# Patient Record
Sex: Male | Born: 1940 | Race: White | Hispanic: No | Marital: Married | Smoking: Former smoker
Health system: Southern US, Community
[De-identification: ages and names within clinical notes are randomized; demographics above are authoritative.]

## PROBLEM LIST (undated history)

## (undated) DIAGNOSIS — I714 Abdominal aortic aneurysm, without rupture, unspecified: Secondary | ICD-10-CM

## (undated) DIAGNOSIS — C50919 Malignant neoplasm of unspecified site of unspecified female breast: Secondary | ICD-10-CM

## (undated) DIAGNOSIS — E785 Hyperlipidemia, unspecified: Secondary | ICD-10-CM

## (undated) DIAGNOSIS — F0390 Unspecified dementia without behavioral disturbance: Secondary | ICD-10-CM

## (undated) DIAGNOSIS — I10 Essential (primary) hypertension: Secondary | ICD-10-CM

## (undated) DIAGNOSIS — I447 Left bundle-branch block, unspecified: Secondary | ICD-10-CM

## (undated) DIAGNOSIS — I509 Heart failure, unspecified: Secondary | ICD-10-CM

## (undated) HISTORY — DX: Abdominal aortic aneurysm, without rupture, unspecified: I71.40

## (undated) HISTORY — DX: Abdominal aortic aneurysm, without rupture: I71.4

## (undated) HISTORY — DX: Malignant neoplasm of unspecified site of unspecified female breast: C50.919

## (undated) HISTORY — DX: Hyperlipidemia, unspecified: E78.5

## (undated) HISTORY — DX: Unspecified dementia, unspecified severity, without behavioral disturbance, psychotic disturbance, mood disturbance, and anxiety: F03.90

## (undated) HISTORY — DX: Heart failure, unspecified: I50.9

## (undated) HISTORY — PX: MASTECTOMY: SHX3

## (undated) HISTORY — DX: Left bundle-branch block, unspecified: I44.7

## (undated) HISTORY — DX: Essential (primary) hypertension: I10

---

## 2000-04-20 ENCOUNTER — Encounter: Payer: Self-pay | Admitting: Family Medicine

## 2000-04-20 ENCOUNTER — Encounter: Admission: RE | Admit: 2000-04-20 | Discharge: 2000-04-20 | Payer: Self-pay | Admitting: Family Medicine

## 2004-07-04 ENCOUNTER — Encounter: Admission: RE | Admit: 2004-07-04 | Discharge: 2004-07-04 | Payer: Self-pay | Admitting: Orthopedic Surgery

## 2006-05-23 IMAGING — NM NM BONE 3 PHASE
10 series · 10 of 10 positions shown · non-contrast
Comparison: none

CLINICAL DATA: Left second toe pain for eight months.
 NUCLEAR MEDICINE THREE PHASE BONE SCAN ? 07/04/04

[st statics, dual detec · 2.36mm/px · 1 of 1 slices shown (1 of 5)]
[im 1/1]
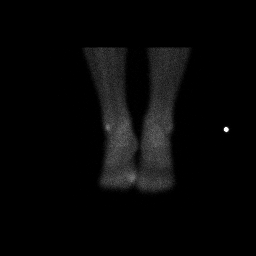

[st statics, dual detec · 2.33mm/px · 1 of 1 slices shown (2 of 5)]
[im 1/1]
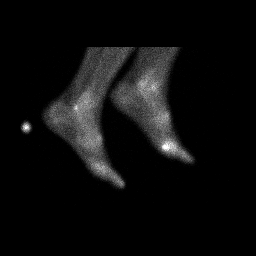

[bf bone flow · 2.33mm/px · 1 of 1 slices shown (1 of 5)]
[im 1/1]
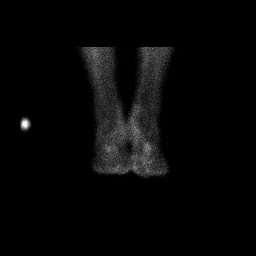

[st statics, dual detec · 2.36mm/px · 1 of 1 slices shown (3 of 5)]
[im 1/1]
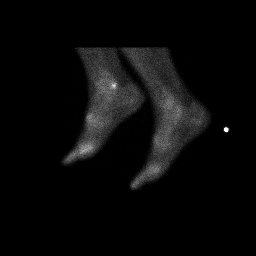

[st statics, dual detec · 2.36mm/px · 1 of 1 slices shown (4 of 5)]
[im 1/1]
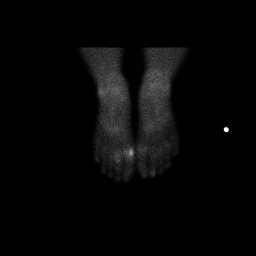

[bf bone flow · 2.33mm/px · 1 of 1 slices shown (2 of 5)]
[im 1/1]
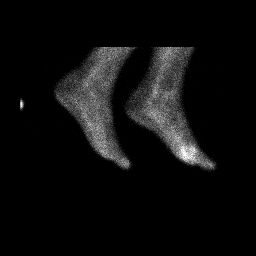

[st statics, dual detec · 2.33mm/px · 1 of 1 slices shown (5 of 5)]
[im 1/1]
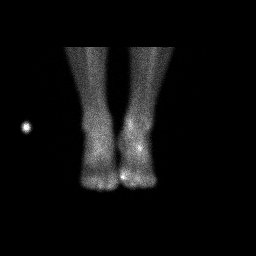

[bf bone flow · 2.36mm/px · 1 of 1 slices shown (3 of 5)]
[im 1/1]
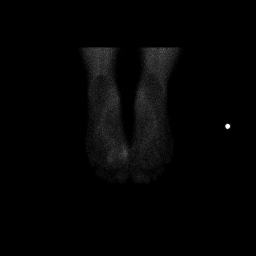

[bf bone flow · 2.36mm/px · 1 of 1 slices shown (4 of 5)]
[im 1/1]
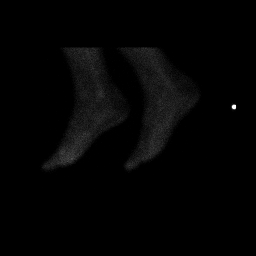

[bf bone flow · 2.36mm/px · 1 of 1 slices shown (5 of 5)]
[im 1/1]
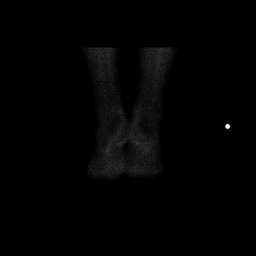

[10 of 10 positions shown; findings below may reference images not displayed]

FINDINGS: With IV injection of 26.1 mCi of Kechnetium-YYm MDP, triple phase bone scan of the bilateral ankles and feet is correlated with previous left forefoot radiographs of [DATE] phase shows symmetrical normal activity.  Progressive on second and third phase is increased uptake primarily at the medial aspect of the left first metatarsal phalangeal joint with lesser uptake at the base of the left second toe.  Progressive increased uptake is also seen at the left greater than right anterior metatarsal tarsal and lateral malleolar region of the left ankle.  No other area of increased or decreased tracer is seen.
 IMPRESSION
 1.  Nonspecific increased uptake, most marked at the medial aspect of the left first metatarsal phalangeal joint with lesser uptake at the base of the left second toe, bilateral anterior tarsal metatarsal articulation, and left lateral malleolus.  These findings may represent chronic stress or early degenerative change.
 2.  Otherwise no significant abnormality.

## 2006-05-26 ENCOUNTER — Inpatient Hospital Stay (HOSPITAL_BASED_OUTPATIENT_CLINIC_OR_DEPARTMENT_OTHER): Admission: RE | Admit: 2006-05-26 | Discharge: 2006-05-26 | Payer: Self-pay | Admitting: Cardiovascular Disease

## 2006-05-26 HISTORY — PX: CARDIAC CATHETERIZATION: SHX172

## 2010-09-30 ENCOUNTER — Ambulatory Visit: Payer: Self-pay | Admitting: Cardiovascular Disease

## 2010-11-16 ENCOUNTER — Encounter: Payer: Self-pay | Admitting: Orthopedic Surgery

## 2011-03-10 ENCOUNTER — Telehealth: Payer: Self-pay | Admitting: Cardiovascular Disease

## 2011-03-10 NOTE — Telephone Encounter (Signed)
Called and spoke w/ mr Roh...HOH and asked if i would cll back tomorrow and speak with his wife.Alfonso Ramus RN

## 2011-03-10 NOTE — Telephone Encounter (Signed)
Please call back when the fax has been made. [phone]. I have pulled the chart.

## 2011-03-10 NOTE — Telephone Encounter (Signed)
Wanted Dr. Elease Hashimoto to fax over what specific blood work test he wants done to [facility] in [place] [phone] ATTN: Harrold Donath (PA) in relation to the next scheduled OV for her husband.

## 2011-03-11 NOTE — Telephone Encounter (Signed)
Wife given update, lab order was faxed.

## 2011-03-13 NOTE — H&P (Signed)
NAMEBUFORD, BREMER            ACCOUNT NO.:  [id number]   MEDICAL RECORD NO.:  [id number]           PATIENT TYPE:   LOCATION:                                 FACILITY:   PHYSICIAN:  Vesta Mixer, M.D. DATE OF BIRTH:  [birth year]   DATE OF ADMISSION:  DATE OF DISCHARGE:                                HISTORY & PHYSICAL   Adam Martin is a 70 year old gentleman who was recently found to have  an abnormal EKG.  He was also found to have hypertension.  We saw him  recently for a stress Cardiolite study, which was abnormal.  He was found to  have reduced left ventricular systolic function with an ejection fraction of  46%.  An echocardiogram also found a reduced ejection fraction, but it  appeared to be more like 25-30% EF.  He was also found to have mild mitral  regurgitation, mild LAE.  He also had some empiric relaxation.  He is now  scheduled for a heart catheterization for further evaluation.   He has had some profound weakness and some shortness of breath.  He has  never had any angina, per say.  He is really not a complainer.  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  He is not been slowed down too much.  He has  eaten lots of salt in the past but states that he has been very careful with  his salt over the past two weeks.   CURRENT MEDICATIONS:  1.  Cozaar 100 mg a day.  2.  Toprol XL 50 mg p.o. b.i.d.  3.  Aspirin 325 mg daily.  4.  Folic acid 800 mg a day.  5.  Fish oil capsules 1200 mg a day.  6.  Vitamin C 500 mg a day.  7.  Tylenol as needed.  8.  Lexapro 5 mg a day.  9.  Lipitor 80 mg a day.  10. Tiazac 360 mg a day.  11. Colchicine 0.6 mg a day.  12. Garlic 2000 mg a day.   ALLERGIES:  None.   PAST MEDICAL HISTORY:  1.  Hypertension.  2.  Hyperlipidemia.   SOCIAL HISTORY:  The patient used to smoke but quit 28 years ago.  He does  not drink alcohol.   FAMILY HISTORY:  His father died at age 48 due to a myocardial infarction.  His mother died at age  82 due to cancer.   REVIEW OF SYSTEMS:  Reviewed and is essentially negative, except as noted in  the HPI.   PHYSICAL EXAMINATION:  VITAL SIGNS:  His weight is 186, which is down 8  pounds from  the last visit.  Blood pressure is 140/88 with a heart rate of  52.  GENERAL:  He is a middle-aged gentleman in no acute distress.  He is alert  and oriented x3 and his mood and affect are normal.  NECK:  Carotids 2+.  He has no bruits.  No JVD.  No thyromegaly.  LUNGS:  Clear to auscultation.  HEART:  Regular rate.  S1 and S2 with no murmurs.  ABDOMEN:  Good bowel sounds and is nontender.  EXTREMITIES:  He has no clubbing, cyanosis or edema.  NEUROLOGIC:  Nonfocal.   Because of [facility]'s moderate-to-severe left ventricular dysfunction by echo,  I would like to go ahead and proceed with a heart catheterization.  There is  a good chance that this is due to a hypertensive cardiomyopathy, but we  certainly need to rule out coronary artery disease.  We have discussed the  risks, benefits and options of heart catheterization.  He understands and  agrees to proceed.           ______________________________  Vesta Mixer, M.D.     PJN/MEDQ  D:  05/24/2006  T:  05/24/2006  Job:  [id number]   cc:   Dr. Pierce Crane

## 2011-03-13 NOTE — Cardiovascular Report (Signed)
NAMEDEKKER, VERGA             ACCOUNT NO.:  [id number]   MEDICAL RECORD NO.:  [id number]          PATIENT TYPE:  OIB   LOCATION:  1966                         FACILITY:  [facility]   PHYSICIAN:  Vesta Mixer, M.D. DATE OF BIRTH:  [birth year]   DATE OF PROCEDURE:  05/26/2006  DATE OF DISCHARGE:                              CARDIAC CATHETERIZATION   Adam Martin is a 70 year old gentleman with a new onset of congestive  heart failure.  He recently found to have abnormal left ventricular systolic  function by Cardiolite scanning.  He is referred for heart catheterization  for further evaluation.   PROCEDURE:  Left heart catheterization.  We performed coronary angiography.  We were not able to cross into the left ventricle due to lack of support  from his tortuous iliac arteries.   We also performed a distal aortogram.   ANGIOGRAPHY:  Left main:  The left main is essentially normal.   The left anterior descending artery is fairly tortuous.  There is mild-to-  moderate disease in the proximal segment ranging between 10-30%.  There are  no critical stenoses.  The first diagonal artery is a small-to-moderate-  sized vessel.  It is approximately 2.0-2.5 mm in size.  There is a proximal  80% stenosis right at the bifurcation.   The second diagonal artery has minor luminal irregularities.   The left circumflex artery is a fairly large vessel.  It has minor luminal  irregularities.  There is a very large first obtuse marginal artery which is  normal.  The second obtuse marginal artery is also normal.   The right coronary artery is a moderate-sized vessel and is dominant.  There  is a 40% proximal stenosis.  This stenosis is somewhat eccentric.  The  remainder of the right coronary artery has mild irregularities.  It gives  off a small-to-moderate-sized posterior descending artery which is  essentially normal.   No left ventriculogram was performed because of lack of support and  tortuous  iliac arteries.  We tried a long exchange wire, but still we were not able  to safely get up to cross the left ventricle.  We were able to cross with a  guidewire.  I did want to a risk a perforation of his iliac arteries.   We did perform an angiogram of his iliac system.  It revealed very tortuous,  but relatively no significant atherosclerosis of the iliac arteries.  If we  need to do a heart catheterization in the future, we will consider using a  long sheath if we are able to get it up to the central aorta.   COMPLICATIONS:  None.   CONCLUSION:  1.  Minor coronary artery irregularities.  2.  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  We will continue with aggressive medical therapy.  We      know that his ejection fraction is around 45% by echocardiogram and by      Cardiolite scanning.  In fact, the echocardiogram suggests that his left      ventricular systolic function is  between      25-30%.  We have already started him on beta blockers and ACE inhalers.      He does not have significant coronary artery disease to explain this      moderate to severe left ventricular dysfunction.  It most likely is due      to his longstanding hypertension.  We will continue with aggressive      medical therapy.           ______________________________  Vesta Mixer, M.D.     PJN/MEDQ  D:  05/26/2006  T:  05/26/2006  Job:  [id number]   cc:   Burnell Blanks, MD

## 2011-03-17 ENCOUNTER — Other Ambulatory Visit: Payer: Self-pay | Admitting: Cardiovascular Disease

## 2011-03-17 ENCOUNTER — Encounter: Payer: Self-pay | Admitting: Physician Assistant

## 2011-03-17 DIAGNOSIS — I10 Essential (primary) hypertension: Secondary | ICD-10-CM

## 2011-04-09 ENCOUNTER — Encounter: Payer: Self-pay | Admitting: Cardiovascular Disease

## 2011-04-14 ENCOUNTER — Ambulatory Visit: Payer: Self-pay | Admitting: Cardiovascular Disease

## 2011-04-15 ENCOUNTER — Telehealth: Payer: Self-pay | Admitting: Cardiovascular Disease

## 2011-04-15 NOTE — Telephone Encounter (Signed)
Called in wanting to know if her husbands labs had been faxed in from his general practitioner [facility]). Please call back.

## 2011-04-15 NOTE — Telephone Encounter (Signed)
Labs were scanned in/are available, ot informed

## 2011-05-01 ENCOUNTER — Ambulatory Visit (INDEPENDENT_AMBULATORY_CARE_PROVIDER_SITE_OTHER): Payer: Medicare Other | Admitting: Cardiovascular Disease

## 2011-05-01 ENCOUNTER — Encounter: Payer: Self-pay | Admitting: Cardiovascular Disease

## 2011-05-01 VITALS — BP 145/80 | HR 60 | Ht 70.0 in | Wt 165.4 lb

## 2011-05-01 DIAGNOSIS — E785 Hyperlipidemia, unspecified: Secondary | ICD-10-CM | POA: Insufficient documentation

## 2011-05-01 DIAGNOSIS — I509 Heart failure, unspecified: Secondary | ICD-10-CM

## 2011-05-01 DIAGNOSIS — I10 Essential (primary) hypertension: Secondary | ICD-10-CM | POA: Insufficient documentation

## 2011-05-01 DIAGNOSIS — F0390 Unspecified dementia without behavioral disturbance: Secondary | ICD-10-CM

## 2011-05-01 DIAGNOSIS — I447 Left bundle-branch block, unspecified: Secondary | ICD-10-CM | POA: Insufficient documentation

## 2011-05-01 NOTE — Assessment & Plan Note (Signed)
Continue with his current dose of Lipitor

## 2011-05-01 NOTE — Assessment & Plan Note (Signed)
Adam Martin is still fairly functional. He gets lots of assistance from his wife.

## 2011-05-01 NOTE — Assessment & Plan Note (Signed)
Adam Martin is doing very well. We'll continue with his same medications. He's not had any episodes of chest pain or shortness of breath.

## 2011-05-01 NOTE — Progress Notes (Signed)
[name] Date of Birth  1940-11-15 [facility] / [facility] [address]     [address] [place], [place]  [address] [phone]  Fax  [phone]  History of Present Illness:  Adam Martin is a middle-aged gentleman with a history of congestive heart failure and a left bundle branch block. He also has a history of hyperlipidemia and hypertension. He's done fairly well since I last saw him. He's has not had any episodes of chest pain or shortness of breath.  Current Outpatient Prescriptions on File Prior to Visit  Medication Sig Dispense Refill  . aspirin 325 MG tablet Take 325 mg by mouth daily.        [name] atorvastatin (LIPITOR) 80 MG tablet Take 80 mg by mouth daily.        . carvedilol (COREG) 12.5 MG tablet Take 12.5 mg by mouth 2 (two) times daily with a meal.        . Cholecalciferol (VITAMIN D) 1000 UNITS capsule Take 1,000 Units by mouth daily.        . colchicine 0.6 MG tablet Take 0.6 mg by mouth daily.        [name] donepezil (ARICEPT) 10 MG tablet Take 10 mg by mouth at bedtime.        . fish oil-omega-3 fatty acids 1000 MG capsule Take 1 g by mouth daily.        . hydrochlorothiazide 25 MG tablet TAKE 1 TABLET DAILY  90 tablet  2  . l-methylfolate-b2-b6-b12 (CEREFOLIN) 03-26-49-5 MG TABS Take 1 tablet by mouth daily.        . memantine (NAMENDA) 10 MG tablet Take 10 mg by mouth 2 (two) times daily.        . risperiDONE (RISPERDAL) 1 MG tablet Take 1.5 mg by mouth daily.        [name] telmisartan (MICARDIS) 80 MG tablet Take 80 mg by mouth daily.        . vitamin C (ASCORBIC ACID) 500 MG tablet Take 500 mg by mouth daily.        . Tadalafil (CIALIS PO) Take by mouth as needed.          Allergies  Allergen Reactions  . Sulfa Drugs Cross Reactors     Past Medical History  Diagnosis Date  . CHF (congestive heart failure)     EF 40%  . LBBB (left bundle branch block)   . Dementia   . Hyperlipidemia   . Hypertension   . AAA (abdominal aortic aneurysm)    . Breast cancer     Past Surgical History  Procedure Date  . Cardiac catheterization 05/26/2006    EF 45%  . Mastectomy     LEFT    History  Smoking status  . Former Smoker  . Quit date: 04/08/1982  Smokeless tobacco  . Not on file    History  Alcohol Use No    Family History  Problem Relation Age of Onset  . Alzheimer's disease Mother   . Lymphoma Mother   . Cancer Father   . Heart attack Father     Reviw of Systems:  Reviewed in the HPI.  All other systems are negative.  Physical Exam: BP 145/80  Pulse 60  Ht 5\' 10"  (1.778 m)  Wt 165 lb 6.4 oz (75.025 kg)  BMI 23.73 kg/m2 The patient is alert and oriented x 3.  The mood and affect are normal.   Skin: warm and dry.  Color is normal.    HEENT:   the sclera are nonicteric.  The mucous membranes are moist.  The carotids are 2+ without bruits.  There is no thyromegaly.  There is no JVD.    Lungs: clear.  The chest wall is non tender.    Heart: regular rate with a normal S1 and S2.  There are no murmurs, gallops, or rubs. The PMI is not displaced.     Abdomin: good bowel sounds.  There is no guarding or rebound.  There is no hepatosplenomegaly or tenderness.  There are no masses.   Extremities:  no clubbing, cyanosis, or edema.  The legs are without rashes.  The distal pulses are intact.   Neuro:  Cranial nerves II - XII are intact.  Motor and sensory functions are intact.    The gait is normal.  Assessment / Plan:

## 2011-07-08 ENCOUNTER — Telehealth: Payer: Self-pay | Admitting: Cardiovascular Disease

## 2011-08-03 ENCOUNTER — Telehealth: Payer: Self-pay | Admitting: Cardiovascular Disease

## 2011-08-03 NOTE — Telephone Encounter (Signed)
Pt needs carvedilol 12.5mg  bid called into a new Pharm which is [facility] [place] pt would like a 90day supply

## 2011-08-04 MED ORDER — CARVEDILOL 12.5 MG PO TABS: 12.5000 mg | ORAL_TABLET | Freq: Two times a day (BID) | ORAL | Status: DC

## 2011-09-22 ENCOUNTER — Telehealth: Payer: Self-pay | Admitting: Cardiovascular Disease

## 2011-09-22 NOTE — Telephone Encounter (Signed)
Pt needs refill on lipitor 80mg  qd with 90day supply and it needs to be called into NEW PHARM[facility] [place] [phone]

## 2011-09-25 MED ORDER — ATORVASTATIN CALCIUM 80 MG PO TABS: 80.0000 mg | ORAL_TABLET | Freq: Every day | ORAL | Status: DC

## 2011-09-25 NOTE — Telephone Encounter (Signed)
Fax Received. Refill Completed. Adam Martin (M.A)  

## 2011-10-08 ENCOUNTER — Telehealth: Payer: Self-pay | Admitting: Cardiovascular Disease

## 2011-10-08 DIAGNOSIS — I10 Essential (primary) hypertension: Secondary | ICD-10-CM

## 2011-10-08 MED ORDER — TELMISARTAN 80 MG PO TABS: 80.0000 mg | ORAL_TABLET | Freq: Every day | ORAL | Status: DC

## 2011-10-08 MED ORDER — HYDROCHLOROTHIAZIDE 25 MG PO TABS: 25.0000 mg | ORAL_TABLET | Freq: Every day | ORAL | Status: DC

## 2011-10-08 NOTE — Telephone Encounter (Signed)
New refill msg Pt wants refill of these meds micardis Hydrochlorothiazide 90 days supply [facility] [phone]

## 2011-10-12 ENCOUNTER — Telehealth: Payer: Self-pay | Admitting: *Deleted

## 2011-10-12 NOTE — Telephone Encounter (Addendum)
Pt will call back with fax number, script written and will give to Omer Jack to fax when number is called in from wife.

## 2011-10-12 NOTE — Telephone Encounter (Signed)
Message copied by Antony Odea on Mon Oct 12, 2011 10:44 AM ------      Message from: Anette Guarneri      Created: Tue Oct 06, 2011 10:17 AM      Regarding: lab order       Hello Jodette,            Pt is coming in office on 11/09/11 for an appt and I told the wife to call me back and I would get a lab order for the labs he needs prior to his appt and fax it to her so pt can get them done. Please let me know when you get an order ready and I will pick it up from you Thank you.

## 2011-10-30 DIAGNOSIS — F3189 Other bipolar disorder: Secondary | ICD-10-CM | POA: Diagnosis not present

## 2011-11-04 ENCOUNTER — Encounter: Payer: Self-pay | Admitting: Cardiovascular Disease

## 2011-11-04 DIAGNOSIS — I10 Essential (primary) hypertension: Secondary | ICD-10-CM | POA: Diagnosis not present

## 2011-11-04 DIAGNOSIS — Z79899 Other long term (current) drug therapy: Secondary | ICD-10-CM | POA: Diagnosis not present

## 2011-11-04 DIAGNOSIS — E7800 Pure hypercholesterolemia, unspecified: Secondary | ICD-10-CM | POA: Diagnosis not present

## 2011-11-09 ENCOUNTER — Encounter: Payer: Self-pay | Admitting: Cardiovascular Disease

## 2011-11-09 ENCOUNTER — Ambulatory Visit (INDEPENDENT_AMBULATORY_CARE_PROVIDER_SITE_OTHER): Payer: Medicare Other | Admitting: Cardiovascular Disease

## 2011-11-09 DIAGNOSIS — I10 Essential (primary) hypertension: Secondary | ICD-10-CM

## 2011-11-09 DIAGNOSIS — E785 Hyperlipidemia, unspecified: Secondary | ICD-10-CM

## 2011-11-09 DIAGNOSIS — I447 Left bundle-branch block, unspecified: Secondary | ICD-10-CM

## 2011-11-09 DIAGNOSIS — I509 Heart failure, unspecified: Secondary | ICD-10-CM

## 2011-11-09 NOTE — Patient Instructions (Signed)
Your physician wants you to follow-up in: 6 months  You will receive a reminder letter in the mail two months in advance. If you don't receive a letter, please call our office to schedule the follow-up appointment.  Your physician recommends that you return for a FASTING lipid profile: 6 months   

## 2011-11-09 NOTE — Progress Notes (Signed)
[name] Date of Birth  Jan 24, 1941 [facility]     [place] [facility]  [address]    [address]   [address] [place], [place]  [address]    [place], [place]  [address] [phone]  Fax  [phone]  [phone]  Fax [phone]   History of Present Illness:    Adam Martin is a 71 yo with a history of congestive heart therapy, hypertension, hyperlipidemia and left bundle branch block.   His ejection fraction is around 40%. He remains very active. He does 100 pushups every night.  He has progressive dementia to  He has no complaints.  Current Outpatient Prescriptions on File Prior to Visit  Medication Sig Dispense Refill  . aspirin 325 MG tablet Take 325 mg by mouth daily.        [name] atorvastatin (LIPITOR) 80 MG tablet Take 1 tablet (80 mg total) by mouth daily.  90 tablet  0  . carvedilol (COREG) 12.5 MG tablet Take 1 tablet (12.5 mg total) by mouth 2 (two) times daily with a meal.  180 tablet  2  . Cholecalciferol (VITAMIN D) 1000 UNITS capsule Take 1,000 Units by mouth daily.        . colchicine 0.6 MG tablet Take 0.6 mg by mouth daily.        [name] donepezil (ARICEPT) 10 MG tablet Take 10 mg by mouth at bedtime.        . fish oil-omega-3 fatty acids 1000 MG capsule Take 1 g by mouth daily.        . hydrochlorothiazide (HYDRODIURIL) 25 MG tablet Take 1 tablet (25 mg total) by mouth daily.  90 tablet  2  . l-methylfolate-b2-b6-b12 (CEREFOLIN) 03-26-49-5 MG TABS Take 1 tablet by mouth daily.        [name] LORazepam (ATIVAN) 0.5 MG tablet Take 0.5 mg by mouth daily.        . memantine (NAMENDA) 10 MG tablet Take 10 mg by mouth 2 (two) times daily.        . risperiDONE (RISPERDAL) 1 MG tablet Take 1.5 mg by mouth daily.        [name] telmisartan (MICARDIS) 80 MG tablet Take 1 tablet (80 mg total) by mouth daily.  90 tablet  2  . vitamin C (ASCORBIC ACID) 500 MG tablet Take 500 mg by mouth daily.          Allergies  Allergen Reactions  . Sulfa Drugs Cross Reactors     Past  Medical History  Diagnosis Date  . CHF (congestive heart failure)     EF 40%  . LBBB (left bundle branch block)   . Dementia   . Hyperlipidemia   . Hypertension   . AAA (abdominal aortic aneurysm)   . Breast cancer     Past Surgical History  Procedure Date  . Cardiac catheterization 05/26/2006    EF 45%  . Mastectomy     LEFT    History  Smoking status  . Former Smoker  . Quit date: 04/08/1982  Smokeless tobacco  . Not on file    History  Alcohol Use No    Family History  Problem Relation Age of Onset  . Alzheimer's disease Mother   . Lymphoma Mother   . Cancer Father   . Heart attack Father     Reviw of Systems:  Reviewed in the HPI.  All other systems are negative.  Physical Exam: BP 150/90  Pulse 53  Ht  5\' 9"  (1.753 m)  Wt 163 lb 12.8 oz (74.299 kg)  BMI 24.19 kg/m2 The patient is alert and oriented x 3.  The mood and affect are normal.   Skin: warm and dry.  Color is normal.    HEENT:   Normocephalic/atraumatic. There is no JVD. His carotids are normal. His neck supple.  Lungs: Lungs are clear to auscultation.   Heart: Regular rate S1-S2.    Abdomen: Has good bowel sounds. There is no hepatosplenomegaly.  Extremities:  No clubbing cyanosis or edema.  Neuro:  He has mild dementia. He is very pleasant. The gait is normal.    ECG: Sinus bradycardia.  Left bundle branch block He has left axis deviation.    Recent lab work drawn by his medical Dr. in [place] reveals a total cholesterol of 138. The HDL is 37. The triglyceride level is 88. The LDL is 83. His creatinine is 1.5. Glucose is 105. Sodium is 143. Potassium is 3.9. Chloride is 103. CO2 is 29. His liver function tests are normal.  Assessment / Plan:

## 2011-11-09 NOTE — Assessment & Plan Note (Signed)
It is clear that Adam Martin still eats a bit of extra salt. His wife states it is difficult to keep him from eating extra salt given his mild dementia. In addition. He does not take his HCTZ on Sunday because he's worried that he may not have a bathroom available throughout the day. I've encouraged him to eat a low-salt diet. We'll continue with his same medications. I'll see him in the office in 6 months for fasting lab work in an office visit.

## 2011-11-10 ENCOUNTER — Other Ambulatory Visit: Payer: Self-pay | Admitting: Cardiovascular Disease

## 2011-11-10 MED ORDER — CARVEDILOL 12.5 MG PO TABS: 12.5000 mg | ORAL_TABLET | Freq: Two times a day (BID) | ORAL | Status: DC

## 2011-11-10 NOTE — Telephone Encounter (Signed)
Fax Received. Refill Completed. Zoe Creasman Chowoe (R.M.A)   

## 2011-12-10 DIAGNOSIS — L570 Actinic keratosis: Secondary | ICD-10-CM | POA: Diagnosis not present

## 2011-12-30 ENCOUNTER — Other Ambulatory Visit: Payer: Self-pay | Admitting: *Deleted

## 2011-12-30 MED ORDER — ATORVASTATIN CALCIUM 80 MG PO TABS: 80.0000 mg | ORAL_TABLET | Freq: Every day | ORAL | Status: DC

## 2012-02-18 DIAGNOSIS — L570 Actinic keratosis: Secondary | ICD-10-CM | POA: Diagnosis not present

## 2012-02-19 DIAGNOSIS — H2510 Age-related nuclear cataract, unspecified eye: Secondary | ICD-10-CM | POA: Diagnosis not present

## 2012-03-31 ENCOUNTER — Telehealth: Payer: Self-pay | Admitting: Cardiovascular Disease

## 2012-03-31 MED ORDER — AMLODIPINE BESYLATE 5 MG PO TABS: 5.0000 mg | ORAL_TABLET | Freq: Every day | ORAL | Status: DC

## 2012-03-31 NOTE — Telephone Encounter (Signed)
New med started, BP BEEN 200/100 TWO DAYS IN A ROW, 165/95 AFTER MEDS. UNSURE OF PULSE, TOLD TO CHECK BP/PULSE TWICE A DAY, APP SET FOR F/U AND TO CALL WITH ANY QUESTIONS OR CONCERNS. PT AGREED TO PLAN.

## 2012-03-31 NOTE — Telephone Encounter (Signed)
Patient wife requests return call at [phone]  Patient has elevated blood pressure over the last couple days last BP reading 200/100 this morning.  Patient would like to be seen.

## 2012-04-06 ENCOUNTER — Telehealth: Payer: Self-pay | Admitting: Cardiovascular Disease

## 2012-04-06 DIAGNOSIS — H6120 Impacted cerumen, unspecified ear: Secondary | ICD-10-CM | POA: Diagnosis not present

## 2012-04-06 DIAGNOSIS — D0000 Carcinoma in situ of oral cavity, unspecified site: Secondary | ICD-10-CM | POA: Diagnosis not present

## 2012-04-06 DIAGNOSIS — D0008 Carcinoma in situ of pharynx: Secondary | ICD-10-CM | POA: Diagnosis not present

## 2012-04-06 MED ORDER — HYDRALAZINE HCL 25 MG PO TABS: 25.0000 mg | ORAL_TABLET | Freq: Three times a day (TID) | ORAL | Status: DC

## 2012-04-06 NOTE — Telephone Encounter (Signed)
bp med added 03/31/12 norvasc 5 mg daily, readings this am 188/112, past lowest x1 140/85 but usually 165/100, 200/100,  pulse remains 55/60 bpm.please advise// spoke with Norma Fredrickson NP start hydralazine 25 mg TID, f/u ov 2 weeks made, wife verbalized understanding of med and f/u visit

## 2012-04-06 NOTE — Telephone Encounter (Signed)
Please return call to patient wife christine regarding patient elevated blood pressure [phone] or [phone].

## 2012-04-06 NOTE — Telephone Encounter (Signed)
FYI:   Per Dr. 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  Dr. Elease Hashimoto out of office till next week.    Also called patient to setup cardiac clearance and wife states they would like to hold off on this whole situation and will call us back.

## 2012-04-06 NOTE — Telephone Encounter (Signed)
Spoke with Christine/ wife, hold thoughts of letter for clearance till I hear back, she is unsure who she wants to do the procedure. She will call back and let us know Dr and when.

## 2012-04-15 DIAGNOSIS — F3189 Other bipolar disorder: Secondary | ICD-10-CM | POA: Diagnosis not present

## 2012-04-21 ENCOUNTER — Ambulatory Visit (INDEPENDENT_AMBULATORY_CARE_PROVIDER_SITE_OTHER): Payer: Medicare Other | Admitting: Cardiovascular Disease

## 2012-04-21 ENCOUNTER — Encounter: Payer: Self-pay | Admitting: Cardiovascular Disease

## 2012-04-21 VITALS — BP 126/70 | HR 64 | Ht 69.0 in | Wt 163.2 lb

## 2012-04-21 DIAGNOSIS — I10 Essential (primary) hypertension: Secondary | ICD-10-CM

## 2012-04-21 DIAGNOSIS — I509 Heart failure, unspecified: Secondary | ICD-10-CM

## 2012-04-21 LAB — BASIC METABOLIC PANEL
BUN: 26 mg/dL — ABNORMAL HIGH (ref 6–23)
Calcium: 9.2 mg/dL (ref 8.4–10.5)
Chloride: 104 mEq/L (ref 96–112)
Creatinine, Ser: 1.7 mg/dL — ABNORMAL HIGH (ref 0.4–1.5)

## 2012-04-21 NOTE — Patient Instructions (Addendum)
Your physician wants you to follow-up in: 6 months  You will receive a reminder letter in the mail two months in advance. If you don't receive a letter, please call our office to schedule the follow-up appointment.   Your physician recommends that you return for lab work in: today bmet   Your physician recommends that you return for a FASTING lipid profile: 6 months

## 2012-04-21 NOTE — Assessment & Plan Note (Signed)
His blood pressures quite good. I rechecked it at 120/65. We will continue the same medications. I told him that he can continue with all of his normal activities.

## 2012-04-21 NOTE — Assessment & Plan Note (Signed)
Degan seems to be doing very well with his congestive heart failure. He's not having any symptoms. His wife confirms the fact that he seems to be doing fairly well. We'll continue the same medications. He has progressive dementia and it is difficult to get him to really talk about his symptoms. He always states that he is feeling great. He does 100 pushups every morning.

## 2012-04-21 NOTE — Progress Notes (Signed)
Adam Martin Date of Birth  Sep 10, 1941       [facility]    [place] [address]  [address] [place], [place]  [address]   [place], [place]  [address] [phone]     [phone]   Fax  [phone]    Fax [phone]  Problem List: 1. Congestive heart failure-EF of 40% 2. Left bundle branch block 3. Dementia 4. Hyperlipidemia 5. Hypertension  History of Present Illness: Adam Martin is a 71 yo with a history of congestive heart therapy, hypertension, hyperlipidemia and left bundle branch block. His ejection fraction is around 40%. He remains very active. He does 100 pushups every night. He has progressive dementia to He has no complaints.  His blood pressure has been markedly elevated for the past week or so. His wife has called the office. We've added hydralazine 25 mg 3 times a day and Norvasc 5 mg a day. His blood pressure is much better. He feels quite well.  He continues to have gradual progressive dementia.     Current Outpatient Prescriptions on File Prior to Visit  Medication Sig Dispense Refill  . amLODipine (NORVASC) 5 MG tablet Take 1 tablet (5 mg total) by mouth daily.  30 tablet  1  . aspirin 325 MG tablet Take 325 mg by mouth daily.        [name] atorvastatin (LIPITOR) 80 MG tablet Take 1 tablet (80 mg total) by mouth daily.  90 tablet  3  . carvedilol (COREG) 12.5 MG tablet Take 1 tablet (12.5 mg total) by mouth 2 (two) times daily with a meal.  60 tablet  5  . Cholecalciferol (VITAMIN D) 1000 UNITS capsule Take 1,000 Units by mouth daily.        . colchicine 0.6 MG tablet Take 0.6 mg by mouth daily.        [name] donepezil (ARICEPT) 10 MG tablet Take 10 mg by mouth at bedtime.        . fish oil-omega-3 fatty acids 1000 MG capsule Take 1 g by mouth daily.        . hydrALAZINE (APRESOLINE) 25 MG tablet Take 1 tablet (25 mg total) by mouth 3 (three) times daily.  90 tablet  3  . l-methylfolate-b2-b6-b12 (CEREFOLIN) 03-26-49-5  MG TABS Take 1 tablet by mouth daily.        [name] LORazepam (ATIVAN) 0.5 MG tablet Take 0.5 mg by mouth daily.        . memantine (NAMENDA) 10 MG tablet Take 10 mg by mouth 2 (two) times daily.        . risperiDONE (RISPERDAL) 1 MG tablet Take 1.5 mg by mouth daily.        [name] telmisartan (MICARDIS) 80 MG tablet Take 1 tablet (80 mg total) by mouth daily.  90 tablet  2  . vitamin C (ASCORBIC ACID) 500 MG tablet Take 1,000 mg by mouth daily.       [name] DISCONTD: hydrochlorothiazide (HYDRODIURIL) 25 MG tablet Take 1 tablet (25 mg total) by mouth daily.  90 tablet  2    Allergies  Allergen Reactions  . Sulfa Drugs Cross Reactors     Past Medical History  Diagnosis Date  . CHF (congestive heart failure)     EF 40%  . LBBB (left bundle branch block)   . Dementia   . Hyperlipidemia   . Hypertension   . AAA (abdominal aortic aneurysm)   . Breast cancer  Past Surgical History  Procedure Date  . Cardiac catheterization 05/26/2006    EF 45%  . Mastectomy     LEFT    History  Smoking status  . Former Smoker  . Quit date: 04/08/1982  Smokeless tobacco  . Not on file    History  Alcohol Use No    Family History  Problem Relation Age of Onset  . Alzheimer's disease Mother   . Lymphoma Mother   . Cancer Father   . Heart attack Father     Reviw of Systems:  Reviewed in the HPI.  All other systems are negative.  Physical Exam: Blood pressure 126/70, pulse 64, height 5\' 9"  (1.753 m), weight 163 lb 3.2 oz (74.027 kg). General: Well developed, well nourished, in no acute distress.  Head: Normocephalic, atraumatic, sclera non-icteric, mucus membranes are moist,   Neck: Supple. Carotids are 2 + without bruits. No JVD  Lungs: Clear bilaterally to auscultation.  Heart: regular rate.  normal  S1 S2. No murmurs, gallops or rubs.  Abdomen: Soft, non-tender, non-distended with normal bowel sounds. No hepatomegaly. No rebound/guarding. No masses.  Msk:  Strength and tone are  normal  Extremities: No clubbing or cyanosis. No edema.  Distal pedal pulses are 2+ and equal bilaterally.  Neuro: Alert and oriented X 3. Moves all extremities spontaneously.  Psych:  Responds to questions appropriately with a normal affect.  ECG:   Assessment / Plan: \

## 2012-04-29 ENCOUNTER — Other Ambulatory Visit: Payer: Self-pay | Admitting: Cardiovascular Disease

## 2012-04-29 MED ORDER — CARVEDILOL 12.5 MG PO TABS: 12.5000 mg | ORAL_TABLET | Freq: Two times a day (BID) | ORAL | Status: DC

## 2012-04-29 NOTE — Telephone Encounter (Signed)
Refill- Carvedilol   Patient would like 90 day supply, verified [facility] [phone].

## 2012-05-13 ENCOUNTER — Ambulatory Visit: Payer: Medicare Other | Admitting: Cardiovascular Disease

## 2012-05-19 ENCOUNTER — Other Ambulatory Visit: Payer: Self-pay | Admitting: Cardiovascular Disease

## 2012-06-06 DIAGNOSIS — M109 Gout, unspecified: Secondary | ICD-10-CM | POA: Diagnosis not present

## 2012-06-06 DIAGNOSIS — Z23 Encounter for immunization: Secondary | ICD-10-CM | POA: Diagnosis not present

## 2012-06-23 ENCOUNTER — Other Ambulatory Visit: Payer: Self-pay | Admitting: *Deleted

## 2012-06-23 MED ORDER — AMLODIPINE BESYLATE 5 MG PO TABS: 5.0000 mg | ORAL_TABLET | Freq: Every day | ORAL | Status: DC

## 2012-06-23 NOTE — Telephone Encounter (Signed)
Refilled amlodipine for 90 day supply

## 2012-07-15 ENCOUNTER — Encounter: Payer: Self-pay | Admitting: Cardiovascular Disease

## 2012-07-15 DIAGNOSIS — I2510 Atherosclerotic heart disease of native coronary artery without angina pectoris: Secondary | ICD-10-CM | POA: Diagnosis not present

## 2012-07-15 DIAGNOSIS — I10 Essential (primary) hypertension: Secondary | ICD-10-CM | POA: Diagnosis not present

## 2012-07-15 DIAGNOSIS — M109 Gout, unspecified: Secondary | ICD-10-CM | POA: Diagnosis not present

## 2012-07-15 DIAGNOSIS — Z125 Encounter for screening for malignant neoplasm of prostate: Secondary | ICD-10-CM | POA: Diagnosis not present

## 2012-07-18 ENCOUNTER — Other Ambulatory Visit: Payer: Self-pay | Admitting: *Deleted

## 2012-07-18 MED ORDER — TELMISARTAN 80 MG PO TABS: 80.0000 mg | ORAL_TABLET | Freq: Every day | ORAL | Status: DC

## 2012-07-18 NOTE — Telephone Encounter (Signed)
Fax Received. Refill Completed. Mack Thurmon Chowoe (R.M.A)   

## 2012-07-19 ENCOUNTER — Other Ambulatory Visit: Payer: Self-pay | Admitting: Cardiovascular Disease

## 2012-07-19 NOTE — Telephone Encounter (Signed)
Fax Received. Refill Completed. Annalisa Colonna Chowoe (R.M.A)   

## 2012-07-22 ENCOUNTER — Other Ambulatory Visit: Payer: Self-pay

## 2012-07-22 MED ORDER — TELMISARTAN 80 MG PO TABS: 80.0000 mg | ORAL_TABLET | Freq: Every day | ORAL | Status: DC

## 2012-07-22 MED ORDER — ATORVASTATIN CALCIUM 80 MG PO TABS: 80.0000 mg | ORAL_TABLET | Freq: Every day | ORAL | Status: DC

## 2012-07-22 MED ORDER — AMLODIPINE BESYLATE 5 MG PO TABS: 5.0000 mg | ORAL_TABLET | Freq: Every day | ORAL | Status: DC

## 2012-07-22 MED ORDER — HYDRALAZINE HCL 25 MG PO TABS: 25.0000 mg | ORAL_TABLET | Freq: Three times a day (TID) | ORAL | Status: DC

## 2012-07-22 MED ORDER — CARVEDILOL 12.5 MG PO TABS: 12.5000 mg | ORAL_TABLET | Freq: Two times a day (BID) | ORAL | Status: DC

## 2012-07-27 ENCOUNTER — Other Ambulatory Visit: Payer: Self-pay | Admitting: *Deleted

## 2012-07-27 MED ORDER — HYDROCHLOROTHIAZIDE 25 MG PO TABS: 12.5000 mg | ORAL_TABLET | Freq: Every day | ORAL | Status: DC

## 2012-07-27 NOTE — Telephone Encounter (Signed)
Fax Received. Refill Completed. Averey Trompeter Chowoe (R.M.A)   

## 2012-08-04 DIAGNOSIS — L570 Actinic keratosis: Secondary | ICD-10-CM | POA: Diagnosis not present

## 2012-08-04 DIAGNOSIS — L723 Sebaceous cyst: Secondary | ICD-10-CM | POA: Diagnosis not present

## 2012-08-08 ENCOUNTER — Encounter: Payer: Self-pay | Admitting: Cardiovascular Disease

## 2012-09-20 DIAGNOSIS — H18419 Arcus senilis, unspecified eye: Secondary | ICD-10-CM | POA: Diagnosis not present

## 2012-09-20 DIAGNOSIS — H2510 Age-related nuclear cataract, unspecified eye: Secondary | ICD-10-CM | POA: Diagnosis not present

## 2012-11-09 DIAGNOSIS — L570 Actinic keratosis: Secondary | ICD-10-CM | POA: Diagnosis not present

## 2012-11-17 DIAGNOSIS — F3189 Other bipolar disorder: Secondary | ICD-10-CM | POA: Diagnosis not present

## 2012-12-06 ENCOUNTER — Other Ambulatory Visit: Payer: Self-pay | Admitting: *Deleted

## 2012-12-06 NOTE — Telephone Encounter (Signed)
Opened in Error.

## 2012-12-21 ENCOUNTER — Other Ambulatory Visit: Payer: Self-pay | Admitting: *Deleted

## 2012-12-21 MED ORDER — CARVEDILOL 12.5 MG PO TABS: 12.5000 mg | ORAL_TABLET | Freq: Two times a day (BID) | ORAL | Status: DC

## 2012-12-21 MED ORDER — HYDROCHLOROTHIAZIDE 12.5 MG PO TABS: 12.5000 mg | ORAL_TABLET | Freq: Every day | ORAL | Status: DC

## 2012-12-21 NOTE — Telephone Encounter (Addendum)
Wife called to request refills refill comleted  Lab date given and an ov

## 2013-01-18 ENCOUNTER — Telehealth: Payer: Self-pay | Admitting: Cardiovascular Disease

## 2013-01-18 NOTE — Telephone Encounter (Signed)
Will refax lab order

## 2013-01-18 NOTE — Telephone Encounter (Signed)
Faxed order to [phone], [facility],

## 2013-01-18 NOTE — Telephone Encounter (Signed)
New problem    Wife returning call back to nurse

## 2013-01-18 NOTE — Telephone Encounter (Signed)
New problem   Pt would like to have his labs done at [facility] in [place]. Pt would like for you to call over the order at  [phone].

## 2013-01-19 ENCOUNTER — Other Ambulatory Visit: Payer: Medicare Other

## 2013-01-19 ENCOUNTER — Encounter: Payer: Self-pay | Admitting: Cardiovascular Disease

## 2013-01-19 DIAGNOSIS — E785 Hyperlipidemia, unspecified: Secondary | ICD-10-CM | POA: Diagnosis not present

## 2013-01-31 ENCOUNTER — Ambulatory Visit (INDEPENDENT_AMBULATORY_CARE_PROVIDER_SITE_OTHER): Payer: Medicare Other | Admitting: Cardiovascular Disease

## 2013-01-31 ENCOUNTER — Encounter: Payer: Self-pay | Admitting: Cardiovascular Disease

## 2013-01-31 VITALS — BP 152/86 | HR 58 | Ht 70.0 in | Wt 158.0 lb

## 2013-01-31 DIAGNOSIS — I509 Heart failure, unspecified: Secondary | ICD-10-CM | POA: Diagnosis not present

## 2013-01-31 DIAGNOSIS — I447 Left bundle-branch block, unspecified: Secondary | ICD-10-CM | POA: Diagnosis not present

## 2013-01-31 DIAGNOSIS — F0390 Unspecified dementia without behavioral disturbance: Secondary | ICD-10-CM | POA: Diagnosis not present

## 2013-01-31 DIAGNOSIS — E785 Hyperlipidemia, unspecified: Secondary | ICD-10-CM | POA: Diagnosis not present

## 2013-01-31 DIAGNOSIS — I10 Essential (primary) hypertension: Secondary | ICD-10-CM | POA: Diagnosis not present

## 2013-01-31 MED ORDER — HYDROCHLOROTHIAZIDE 25 MG PO TABS: 25.0000 mg | ORAL_TABLET | Freq: Every day | ORAL | Status: DC

## 2013-01-31 NOTE — Patient Instructions (Addendum)
Your physician wants you to follow-up in: 6 MONTHS You will receive a reminder letter in the mail two months in advance. If you don't receive a letter, please call our office to schedule the follow-up appointment.  Your physician recommends that you return for lab work in: 6 WEEKS BMET  Your physician has recommended you make the following change in your medication:   INCREASE HCTZ TO 25 MG ONCE A DAY

## 2013-01-31 NOTE — Assessment & Plan Note (Signed)
Adam Martin continues to have a mildly elevated blood pressure. He admits to eating extra salt. He had popcorn last night.  We'll increase his HCTZ to 25 mg a day. He eats a banana every day. We will check a basic metabolic profile in 6 weeks. I'll see him again in 6 months for followup office visit.

## 2013-01-31 NOTE — Assessment & Plan Note (Signed)
Stable

## 2013-01-31 NOTE — Progress Notes (Signed)
Adam Martin Date of Birth  19-Oct-1941       [facility]    [place] [address]  [address] [place], [place]  [address]   [place], [place]  [address] [phone]     [phone]   Fax  [phone]    Fax [phone]  Problem List: 1. Congestive heart failure-EF of 40% 2. Left bundle branch block 3. Dementia 4. Hyperlipidemia 5. Hypertension  History of Present Illness: Adam Martin is a 72 yo with a history of congestive heart therapy, hypertension, hyperlipidemia and left bundle branch block. His ejection fraction is around 40%. He remains very active. He does 100 pushups every night. He has progressive dementia to He has no complaints.  His blood pressure has been markedly elevated for the past week or so. His wife has called the office. We've added hydralazine 25 mg 3 times a day and Norvasc 5 mg a day. His blood pressure is much better. He feels quite well.  He continues to have gradual progressive dementia.   January 31, 2013:    Adam Martin is doing well - no CP or dyspnea.  Still eating some salt - had popcorn last night.    Current Outpatient Prescriptions on File Prior to Visit  Medication Sig Dispense Refill  . amLODipine (NORVASC) 5 MG tablet Take 1 tablet (5 mg total) by mouth daily.  90 tablet  3  . atorvastatin (LIPITOR) 80 MG tablet Take 1 tablet (80 mg total) by mouth daily.  90 tablet  3  . carvedilol (COREG) 12.5 MG tablet Take 1 tablet (12.5 mg total) by mouth 2 (two) times daily with a meal.  180 tablet  3  . Cholecalciferol (VITAMIN D) 1000 UNITS capsule Take 1,000 Units by mouth daily.        . colchicine 0.6 MG tablet Take 0.6 mg by mouth daily.        [name] donepezil (ARICEPT) 10 MG tablet Take 10 mg by mouth at bedtime.        . fish oil-omega-3 fatty acids 1000 MG capsule Take 1 g by mouth daily.        . hydrALAZINE (APRESOLINE) 25 MG tablet Take 1 tablet (25 mg total) by mouth 3 (three) times daily.  540 tablet   3  . hydrochlorothiazide (HYDRODIURIL) 12.5 MG tablet Take 1 tablet (12.5 mg total) by mouth daily.  90 tablet  3  . l-methylfolate-b2-b6-b12 (CEREFOLIN) 03-26-49-5 MG TABS Take 1 tablet by mouth daily.        [name] LORazepam (ATIVAN) 0.5 MG tablet Take 0.5 mg by mouth daily.        . memantine (NAMENDA) 10 MG tablet Take 10 mg by mouth 2 (two) times daily.        . risperiDONE (RISPERDAL) 1 MG tablet Take 1.5 mg by mouth daily.        [name] telmisartan (MICARDIS) 80 MG tablet Take 1 tablet (80 mg total) by mouth daily.  90 tablet  2  . vitamin C (ASCORBIC ACID) 500 MG tablet Take 1,000 mg by mouth daily.        No current facility-administered medications on file prior to visit.    Allergies  Allergen Reactions  . Sulfa Drugs Cross Reactors     Past Medical History  Diagnosis Date  . CHF (congestive heart failure)     EF 40%  . LBBB (left bundle branch block)   . Dementia   .  Hyperlipidemia   . Hypertension   . AAA (abdominal aortic aneurysm)   . Breast cancer     Past Surgical History  Procedure Laterality Date  . Cardiac catheterization  05/26/2006    EF 45%  . Mastectomy      LEFT    History  Smoking status  . Former Smoker  . Quit date: 04/08/1982  Smokeless tobacco  . Not on file    History  Alcohol Use No    Family History  Problem Relation Age of Onset  . Alzheimer's disease Mother   . Lymphoma Mother   . Cancer Father   . Heart attack Father     Reviw of Systems:  Reviewed in the HPI.  All other systems are negative.  Physical Exam: Blood pressure 152/86, pulse 58, height 5\' 10"  (1.778 m), weight 158 lb (71.668 kg), SpO2 98.00%. General: Well developed, well nourished, in no acute distress.  Head: Normocephalic, atraumatic, sclera non-icteric, mucus membranes are moist,   Neck: Supple. Carotids are 2 + without bruits. No JVD  Lungs: Clear bilaterally to auscultation.  Heart: regular rate.  normal  S1 S2. No murmurs, gallops or rubs.  Abdomen: Soft,  non-tender, non-distended with normal bowel sounds. No hepatomegaly. No rebound/guarding. No masses.  Msk:  Strength and tone are normal  Extremities: No clubbing or cyanosis. No edema.  Distal pedal pulses are 2+ and equal bilaterally.  Neuro: Alert and oriented X 3. Moves all extremities spontaneously.  Psych:  Responds to questions appropriately with a normal affect.  ECG:  January 31, 2013:  Sinus brady at 56.  LBBB.    Assessment / Plan:

## 2013-02-07 DIAGNOSIS — H579 Unspecified disorder of eye and adnexa: Secondary | ICD-10-CM | POA: Diagnosis not present

## 2013-02-08 DIAGNOSIS — H1030 Unspecified acute conjunctivitis, unspecified eye: Secondary | ICD-10-CM | POA: Diagnosis not present

## 2013-03-15 ENCOUNTER — Other Ambulatory Visit (INDEPENDENT_AMBULATORY_CARE_PROVIDER_SITE_OTHER): Payer: Medicare Other

## 2013-03-15 DIAGNOSIS — I447 Left bundle-branch block, unspecified: Secondary | ICD-10-CM | POA: Diagnosis not present

## 2013-03-15 DIAGNOSIS — I509 Heart failure, unspecified: Secondary | ICD-10-CM | POA: Diagnosis not present

## 2013-03-15 DIAGNOSIS — E785 Hyperlipidemia, unspecified: Secondary | ICD-10-CM

## 2013-03-15 DIAGNOSIS — I10 Essential (primary) hypertension: Secondary | ICD-10-CM | POA: Diagnosis not present

## 2013-03-15 LAB — BASIC METABOLIC PANEL
CO2: 30 mEq/L (ref 19–32)
Calcium: 8.8 mg/dL (ref 8.4–10.5)
GFR: 42.84 mL/min — ABNORMAL LOW (ref >60.00)
Sodium: 139 mEq/L (ref 135–145)

## 2013-03-21 ENCOUNTER — Telehealth: Payer: Self-pay | Admitting: Cardiovascular Disease

## 2013-03-21 NOTE — Telephone Encounter (Signed)
Results called

## 2013-03-21 NOTE — Telephone Encounter (Signed)
Follow Up  Wife states she received a call regarding his lab results.  She was returning a call.

## 2013-04-27 ENCOUNTER — Other Ambulatory Visit: Payer: Self-pay | Admitting: Cardiovascular Disease

## 2013-05-09 DIAGNOSIS — L570 Actinic keratosis: Secondary | ICD-10-CM | POA: Diagnosis not present

## 2013-05-17 DIAGNOSIS — F3189 Other bipolar disorder: Secondary | ICD-10-CM | POA: Diagnosis not present

## 2013-07-24 ENCOUNTER — Other Ambulatory Visit: Payer: Self-pay | Admitting: Cardiovascular Disease

## 2013-07-27 DIAGNOSIS — L570 Actinic keratosis: Secondary | ICD-10-CM | POA: Diagnosis not present

## 2013-07-27 DIAGNOSIS — L821 Other seborrheic keratosis: Secondary | ICD-10-CM | POA: Diagnosis not present

## 2013-08-29 ENCOUNTER — Ambulatory Visit (INDEPENDENT_AMBULATORY_CARE_PROVIDER_SITE_OTHER): Payer: Medicare Other | Admitting: Cardiovascular Disease

## 2013-08-29 ENCOUNTER — Encounter: Payer: Self-pay | Admitting: Cardiovascular Disease

## 2013-08-29 VITALS — BP 157/89 | HR 54 | Ht 69.0 in | Wt 159.8 lb

## 2013-08-29 DIAGNOSIS — I509 Heart failure, unspecified: Secondary | ICD-10-CM | POA: Diagnosis not present

## 2013-08-29 DIAGNOSIS — I5022 Chronic systolic (congestive) heart failure: Secondary | ICD-10-CM | POA: Diagnosis not present

## 2013-08-29 DIAGNOSIS — E785 Hyperlipidemia, unspecified: Secondary | ICD-10-CM | POA: Diagnosis not present

## 2013-08-29 LAB — HEPATIC FUNCTION PANEL
ALT: 21 U/L (ref 0–53)
Bilirubin, Direct: 0.1 mg/dL (ref 0.0–0.3)
Total Bilirubin: 0.6 mg/dL (ref 0.3–1.2)

## 2013-08-29 LAB — BASIC METABOLIC PANEL
BUN: 24 mg/dL — ABNORMAL HIGH (ref 6–23)
Creatinine, Ser: 1.5 mg/dL (ref 0.4–1.5)
GFR: 50.3 mL/min — ABNORMAL LOW (ref >60.00)
Glucose, Bld: 93 mg/dL (ref 70–99)
Potassium: 3.3 mEq/L — ABNORMAL LOW (ref 3.5–5.1)

## 2013-08-29 LAB — LIPID PANEL: VLDL: 14.8 mg/dL (ref 0.0–40.0)

## 2013-08-29 NOTE — Assessment & Plan Note (Addendum)
There seems to be doing well. He remains completely asymptomatic. He has mild to moderate dementia. He is here with his wife today.  He still eats occasional salty meal. His blood pressures little elevated. He's on carvedilol and Micardis.  We will have his wife measures blood pressure for the next several weeks and we'll send [place] the readings via my chart.  If  His blood pressure remains elevated we will increase his amlodipine to 10 mg a day.  We'll check labs including fasting lipid profile, hepatic profile, and basic metabolic profile. We'll see him again in 6 months with similar labs.

## 2013-08-29 NOTE — Progress Notes (Signed)
Adam Martin Date of Birth  1940-11-03       [facility]    [place] [address]  [address] [place], [place]  [address]   [place], [place]  [address] [phone]     [phone]   Fax  [phone]    Fax [phone]  Problem List: 1. Congestive heart failure-EF of 40% 2. Left bundle branch block 3. Dementia 4. Hyperlipidemia 5. Hypertension  History of Present Illness: Adam Martin is a 72 yo with a history of congestive heart therapy, hypertension, hyperlipidemia and left bundle branch block. His ejection fraction is around 40%. He remains very active. He does 100 pushups every night. He has progressive dementia to He has no complaints.  His blood pressure has been markedly elevated for the past week or so. His wife has called the office. We've added hydralazine 25 mg 3 times a day and Norvasc 5 mg a day. His blood pressure is much better. He feels quite well.  He continues to have gradual progressive dementia.   January 31, 2013:    Adam Martin is doing well - no CP or dyspnea.  Still eating some salt - had popcorn last night.    Nov. 4, 2014:  He is doing well.  His BP remains slightly elevated.    Typically it is 140s.   Breathing is ok.    Current Outpatient Prescriptions on File Prior to Visit  Medication Sig Dispense Refill  . amLODipine (NORVASC) 5 MG tablet TAKE 1 TABLET DAILY  90 tablet  2  . aspirin 81 MG tablet Take 81 mg by mouth daily.      [name] atorvastatin (LIPITOR) 80 MG tablet TAKE 1 TABLET DAILY  90 tablet  2  . carvedilol (COREG) 12.5 MG tablet Take 1 tablet (12.5 mg total) by mouth 2 (two) times daily with a meal.  180 tablet  3  . Cholecalciferol (VITAMIN D) 1000 UNITS capsule Take 1,000 Units by mouth daily.        . colchicine 0.6 MG tablet Take 0.6 mg by mouth daily.        [name] donepezil (ARICEPT) 10 MG tablet Take 10 mg by mouth at bedtime.        . fish oil-omega-3 fatty acids 1000 MG capsule Take 1 g by mouth  daily.        . hydrALAZINE (APRESOLINE) 25 MG tablet TAKE 1 TABLET THREE TIMES A DAY  270 tablet  2  . hydrochlorothiazide (HYDRODIURIL) 25 MG tablet Take 1 tablet (25 mg total) by mouth daily.  90 tablet  3  . l-methylfolate-b2-b6-b12 (CEREFOLIN) 03-26-49-5 MG TABS Take 1 tablet by mouth daily.        [name] LORazepam (ATIVAN) 0.5 MG tablet Take 0.5 mg by mouth daily.        . memantine (NAMENDA) 10 MG tablet Take 10 mg by mouth 2 (two) times daily.        [name] MICARDIS 80 MG tablet TAKE 1 TABLET DAILY  90 tablet  1  . risperiDONE (RISPERDAL) 1 MG tablet Take 1.5 mg by mouth daily.        . vitamin C (ASCORBIC ACID) 500 MG tablet Take 1,000 mg by mouth daily.        No current facility-administered medications on file prior to visit.    Allergies  Allergen Reactions  . Sulfa Drugs Cross Reactors     Past Medical History  Diagnosis Date  .  CHF (congestive heart failure)     EF 40%  . LBBB (left bundle branch block)   . Dementia   . Hyperlipidemia   . Hypertension   . AAA (abdominal aortic aneurysm)   . Breast cancer     Past Surgical History  Procedure Laterality Date  . Cardiac catheterization  05/26/2006    EF 45%  . Mastectomy      LEFT    History  Smoking status  . Former Smoker  . Quit date: 04/08/1982  Smokeless tobacco  . Not on file    History  Alcohol Use No    Family History  Problem Relation Age of Onset  . Alzheimer's disease Mother   . Lymphoma Mother   . Cancer Father   . Heart attack Father     Reviw of Systems:  Reviewed in the HPI.  All other systems are negative.  Physical Exam: Blood pressure 157/89, pulse 54, height 5\' 9"  (1.753 m), weight 159 lb 12.8 oz (72.485 kg). General: Well developed, well nourished, in no acute distress.  Head: Normocephalic, atraumatic, sclera non-icteric, mucus membranes are moist,   Neck: Supple. Carotids are 2 + without bruits. No JVD  Lungs: Clear bilaterally to auscultation.  Heart: regular rate.  normal   S1 S2. No murmurs, gallops or rubs.  Abdomen: Soft, non-tender, non-distended with normal bowel sounds. No hepatomegaly. No rebound/guarding. No masses.  Msk:  Strength and tone are normal  Extremities: No clubbing or cyanosis. No edema.  Distal pedal pulses are 2+ and equal bilaterally.  Neuro: Alert and oriented X 3. Moves all extremities spontaneously.  Psych:  Responds to questions appropriately with a normal affect.  ECG:  January 31, 2013:  Sinus brady at 19.  LBBB.    Assessment / Plan:

## 2013-08-29 NOTE — Patient Instructions (Signed)
Your physician wants you to follow-up in:6 MONTHS  You will receive a reminder letter in the mail two months in advance. If you don't receive a letter, please call our office to schedule the follow-up appointment.   Your physician recommends that you continue on your current medications as directed. Please refer to the Current Medication list given to you today.   Your physician recommends that you return for lab work in: TODAY BMET LIPID LIVER

## 2013-08-31 ENCOUNTER — Telehealth: Payer: Self-pay | Admitting: *Deleted

## 2013-08-31 ENCOUNTER — Other Ambulatory Visit: Payer: Self-pay

## 2013-08-31 DIAGNOSIS — Z23 Encounter for immunization: Secondary | ICD-10-CM | POA: Diagnosis not present

## 2013-08-31 DIAGNOSIS — E876 Hypokalemia: Secondary | ICD-10-CM

## 2013-08-31 MED ORDER — POTASSIUM CHLORIDE CRYS ER 10 MEQ PO TBCR: 10.0000 meq | EXTENDED_RELEASE_TABLET | Freq: Every day | ORAL | Status: DC

## 2013-08-31 NOTE — Telephone Encounter (Signed)
Pt aware/ lab date given/

## 2013-08-31 NOTE — Telephone Encounter (Signed)
Message copied by Antony Odea on Thu Aug 31, 2013  1:38 PM ------      Message from: Vesta Mixer      Created: Tue Aug 29, 2013  5:53 PM       His potassium is low. Please have him at [facility] 10 mEq a day. Recheck basic metabolic profile in one month. ------

## 2013-09-15 ENCOUNTER — Telehealth: Payer: Self-pay | Admitting: Cardiovascular Disease

## 2013-09-15 MED ORDER — AMLODIPINE BESYLATE 10 MG PO TABS: ORAL_TABLET | ORAL | Status: DC

## 2013-09-15 NOTE — Telephone Encounter (Signed)
MED ORDERED/ PT INFORMED

## 2013-09-15 NOTE — Telephone Encounter (Signed)
New Problem  Pt wife called---- States that she received a diet to change the pt's BP (watching sodium intake// she states that it was not effective)  //// was told to double up on Amiodipine/// she wanted to confirm that she was doubling on the correct medication, and if so she needs a refill to [facility]// Please call back to discuss.

## 2013-09-18 ENCOUNTER — Other Ambulatory Visit: Payer: Self-pay | Admitting: Cardiovascular Disease

## 2013-10-04 ENCOUNTER — Other Ambulatory Visit (INDEPENDENT_AMBULATORY_CARE_PROVIDER_SITE_OTHER): Payer: Medicare Other

## 2013-10-04 DIAGNOSIS — E785 Hyperlipidemia, unspecified: Secondary | ICD-10-CM | POA: Diagnosis not present

## 2013-10-04 LAB — HEPATIC FUNCTION PANEL
ALT: 22 U/L (ref 0–53)
AST: 18 U/L (ref 0–37)
Albumin: 3.9 g/dL (ref 3.5–5.2)
Alkaline Phosphatase: 49 U/L (ref 39–117)
Total Protein: 6.8 g/dL (ref 6.0–8.3)

## 2013-10-04 LAB — BASIC METABOLIC PANEL
Calcium: 9 mg/dL (ref 8.4–10.5)
Creatinine, Ser: 1.6 mg/dL — ABNORMAL HIGH (ref 0.4–1.5)
GFR: 45.57 mL/min — ABNORMAL LOW (ref >60.00)
Potassium: 3.8 mEq/L (ref 3.5–5.1)

## 2013-10-04 LAB — LIPID PANEL
Cholesterol: 117 mg/dL (ref 0–200)
HDL: 40.9 mg/dL (ref >39.00)
Total CHOL/HDL Ratio: 3
Triglycerides: 54 mg/dL (ref 0.0–149.0)
VLDL: 10.8 mg/dL (ref 0.0–40.0)

## 2013-10-06 ENCOUNTER — Other Ambulatory Visit: Payer: Self-pay

## 2013-10-06 MED ORDER — ATORVASTATIN CALCIUM 80 MG PO TABS: ORAL_TABLET | ORAL | Status: DC

## 2013-10-30 ENCOUNTER — Other Ambulatory Visit: Payer: Self-pay | Admitting: Cardiovascular Disease

## 2013-10-30 NOTE — Telephone Encounter (Signed)
No other info °

## 2013-10-31 DIAGNOSIS — F3189 Other bipolar disorder: Secondary | ICD-10-CM | POA: Diagnosis not present

## 2013-11-09 DIAGNOSIS — C44320 Squamous cell carcinoma of skin of unspecified parts of face: Secondary | ICD-10-CM | POA: Diagnosis not present

## 2013-11-09 DIAGNOSIS — L570 Actinic keratosis: Secondary | ICD-10-CM | POA: Diagnosis not present

## 2013-12-24 ENCOUNTER — Other Ambulatory Visit: Payer: Self-pay | Admitting: Cardiovascular Disease

## 2014-03-01 DIAGNOSIS — C44621 Squamous cell carcinoma of skin of unspecified upper limb, including shoulder: Secondary | ICD-10-CM | POA: Diagnosis not present

## 2014-03-01 DIAGNOSIS — L570 Actinic keratosis: Secondary | ICD-10-CM | POA: Diagnosis not present

## 2014-03-03 ENCOUNTER — Other Ambulatory Visit: Payer: Self-pay | Admitting: Cardiovascular Disease

## 2014-03-05 ENCOUNTER — Ambulatory Visit (INDEPENDENT_AMBULATORY_CARE_PROVIDER_SITE_OTHER): Payer: Medicare Other | Admitting: *Deleted

## 2014-03-05 DIAGNOSIS — E876 Hypokalemia: Secondary | ICD-10-CM | POA: Diagnosis not present

## 2014-03-05 DIAGNOSIS — E785 Hyperlipidemia, unspecified: Secondary | ICD-10-CM

## 2014-03-05 DIAGNOSIS — I10 Essential (primary) hypertension: Secondary | ICD-10-CM | POA: Diagnosis not present

## 2014-03-05 LAB — LIPID PANEL
CHOLESTEROL: 117 mg/dL (ref 0–200)
HDL: 41.5 mg/dL (ref >39.00)
LDL Cholesterol: 66 mg/dL (ref 0–99)
Total CHOL/HDL Ratio: 3
Triglycerides: 50 mg/dL (ref 0.0–149.0)
VLDL: 10 mg/dL (ref 0.0–40.0)

## 2014-03-05 LAB — BASIC METABOLIC PANEL
BUN: 22 mg/dL (ref 6–23)
CO2: 29 mEq/L (ref 19–32)
Calcium: 9.2 mg/dL (ref 8.4–10.5)
Chloride: 104 mEq/L (ref 96–112)
Creatinine, Ser: 1.4 mg/dL (ref 0.4–1.5)
GFR: 53.16 mL/min — ABNORMAL LOW (ref >60.00)
GLUCOSE: 108 mg/dL — AB (ref 70–99)
POTASSIUM: 3.9 meq/L (ref 3.5–5.1)
Sodium: 140 mEq/L (ref 135–145)

## 2014-03-05 LAB — HEPATIC FUNCTION PANEL
ALBUMIN: 3.8 g/dL (ref 3.5–5.2)
ALT: 24 U/L (ref 0–53)
AST: 20 U/L (ref 0–37)
Alkaline Phosphatase: 45 U/L (ref 39–117)
Bilirubin, Direct: 0 mg/dL (ref 0.0–0.3)
TOTAL PROTEIN: 6.7 g/dL (ref 6.0–8.3)
Total Bilirubin: 0.5 mg/dL (ref 0.2–1.2)

## 2014-03-08 ENCOUNTER — Other Ambulatory Visit: Payer: Self-pay | Admitting: Cardiovascular Disease

## 2014-03-09 ENCOUNTER — Ambulatory Visit (INDEPENDENT_AMBULATORY_CARE_PROVIDER_SITE_OTHER): Payer: Medicare Other | Admitting: Cardiovascular Disease

## 2014-03-09 ENCOUNTER — Encounter: Payer: Self-pay | Admitting: Cardiovascular Disease

## 2014-03-09 VITALS — BP 150/76 | HR 48 | Ht 69.0 in | Wt 156.0 lb

## 2014-03-09 DIAGNOSIS — I509 Heart failure, unspecified: Secondary | ICD-10-CM | POA: Diagnosis not present

## 2014-03-09 DIAGNOSIS — I10 Essential (primary) hypertension: Secondary | ICD-10-CM

## 2014-03-09 DIAGNOSIS — I447 Left bundle-branch block, unspecified: Secondary | ICD-10-CM | POA: Diagnosis not present

## 2014-03-09 DIAGNOSIS — E785 Hyperlipidemia, unspecified: Secondary | ICD-10-CM | POA: Diagnosis not present

## 2014-03-09 NOTE — Progress Notes (Signed)
Adam Martin Date of Birth  [birth year]       [place] [address], [place], [place]  [address]   [place], [place]  [address] [phone]     [phone]   Fax  [phone]    Fax [phone]  Problem List: 1. Congestive heart failure-EF of 40% 2. Left bundle branch block 3. Dementia 4. Hyperlipidemia 5. Hypertension  History of Present Illness: Adam Martin is a 73 yo with a history of congestive heart therapy, hypertension, hyperlipidemia and left bundle branch block. His ejection fraction is around 40%. He remains very active. He does 100 pushups every night. He has progressive dementia to He has no complaints.  His blood pressure has been markedly elevated for the past week or so. His wife has called the office. We've added hydralazine 25 mg 3 times a day and Norvasc 5 mg a day. His blood pressure is much better. He feels quite well.  He continues to have gradual progressive dementia.   January 31, 2013:    Adam Martin is doing well - no CP or dyspnea.  Still eating some salt - had popcorn last night.    Nov. 4, 2014: He is doing well.  His BP remains slightly elevated.    Typically it is 140s.   Breathing is ok.  Mar 09, 2014:     Adam Martin is doing well.  No cardiac problems.  Still exercising regularly.   Current Outpatient Prescriptions on File Prior to Visit  Medication Sig Dispense Refill  . amLODipine (NORVASC) 10 MG tablet TAKE 1 TABLET DAILY  90 tablet  3  . aspirin 81 MG tablet Take 81 mg by mouth daily.      [name] atorvastatin (LIPITOR) 80 MG tablet TAKE 1 TABLET DAILY  90 tablet  2  . carvedilol (COREG) 12.5 MG tablet TAKE 1 TABLET TWICE A DAY WITH MEALS  180 tablet  2  . Cholecalciferol (VITAMIN D) 1000 UNITS capsule Take 1,000 Units by mouth daily.        . colchicine 0.6 MG tablet Take 0.6 mg by mouth daily.        [name] donepezil (ARICEPT) 10 MG tablet Take 10 mg by mouth at bedtime.        . fish  oil-omega-3 fatty acids 1000 MG capsule Take 1 g by mouth daily.        . hydrALAZINE (APRESOLINE) 25 MG tablet TAKE 1 TABLET THREE TIMES A DAY  270 tablet  2  . hydrochlorothiazide (HYDRODIURIL) 25 MG tablet TAKE 1 TABLET DAILY (DOSE INCREASE)  90 tablet  0  . l-methylfolate-b2-b6-b12 (CEREFOLIN) 03-26-49-5 MG TABS Take 1 tablet by mouth daily.        [name] LORazepam (ATIVAN) 0.5 MG tablet Take 0.5 mg by mouth daily.        . memantine (NAMENDA) 10 MG tablet Take 10 mg by mouth 2 (two) times daily.        [name] MICARDIS 80 MG tablet TAKE 1 TABLET DAILY  90 tablet  0  . potassium chloride (K-DUR,KLOR-CON) 10 MEQ tablet Take 1 tablet (10 mEq total) by mouth daily.  90 tablet  3  . risperiDONE (RISPERDAL) 1 MG tablet Take 1.5 mg by mouth daily.        . vitamin C (ASCORBIC ACID) 500 MG tablet Take 1,000 mg by mouth daily.        No current facility-administered  medications on file prior to visit.    Allergies  Allergen Reactions  . Sulfa Drugs Cross Reactors     Past Medical History  Diagnosis Date  . CHF (congestive heart failure)     EF 40%  . LBBB (left bundle branch block)   . Dementia   . Hyperlipidemia   . Hypertension   . AAA (abdominal aortic aneurysm)   . Breast cancer     Past Surgical History  Procedure Laterality Date  . Cardiac catheterization  05/26/2006    EF 45%  . Mastectomy      LEFT    History  Smoking status  . Former Smoker  . Quit date: 04/08/1982  Smokeless tobacco  . Not on file    History  Alcohol Use No    Family History  Problem Relation Age of Onset  . Alzheimer's disease Mother   . Lymphoma Mother   . Cancer Father   . Heart attack Father     Reviw of Systems:  Reviewed in the HPI.  All other systems are negative.  Physical Exam: Blood pressure 150/76, pulse 48, height 5\' 9"  (1.753 m), weight 156 lb (70.761 kg). General: Well developed, well nourished, in no acute distress.  Head: Normocephalic, atraumatic, sclera non-icteric, mucus  membranes are moist,   Neck: Supple. Carotids are 2 + without bruits. No JVD  Lungs: Clear bilaterally to auscultation.  Heart: regular rate.  normal  S1 S2. No murmurs, gallops or rubs.  Abdomen: Soft, non-tender, non-distended with normal bowel sounds. No hepatomegaly. No rebound/guarding. No masses.  Msk:  Strength and tone are normal  Extremities: No clubbing or cyanosis. No edema.  Distal pedal pulses are 2+ and equal bilaterally.  Neuro: Alert and oriented X 3. Moves all extremities spontaneously.  Psych:  Responds to questions appropriately with a normal affect.  ECG:  Mar 09, 2014:  Sinus brady, NS IVCD.    Assessment / Plan:

## 2014-03-09 NOTE — Assessment & Plan Note (Signed)
Adam Martin is doing well.   He is exercising regularly.  No cardiac complication. BP is a bit elevated today but his readings at home are quite normal.  We'll continue with his same medications. I'll see him again in 6 months.

## 2014-03-09 NOTE — Patient Instructions (Signed)
Your physician recommends that you continue on your current medications as directed. Please refer to the Current Medication list given to you today.  Your physician wants you to follow-up in: 6 months with fasting labs on or before the appointment. You will receive a reminder letter in the mail two months in advance. If you don't receive a letter, please call our office to schedule the follow-up appointment.

## 2014-03-29 DIAGNOSIS — C44621 Squamous cell carcinoma of skin of unspecified upper limb, including shoulder: Secondary | ICD-10-CM | POA: Diagnosis not present

## 2014-04-05 ENCOUNTER — Other Ambulatory Visit: Payer: Self-pay | Admitting: Cardiovascular Disease

## 2014-04-17 DIAGNOSIS — F3189 Other bipolar disorder: Secondary | ICD-10-CM | POA: Diagnosis not present

## 2014-04-19 DIAGNOSIS — L570 Actinic keratosis: Secondary | ICD-10-CM | POA: Diagnosis not present

## 2014-04-19 DIAGNOSIS — C44319 Basal cell carcinoma of skin of other parts of face: Secondary | ICD-10-CM | POA: Diagnosis not present

## 2014-05-16 ENCOUNTER — Other Ambulatory Visit: Payer: Self-pay | Admitting: Cardiovascular Disease

## 2014-06-01 ENCOUNTER — Other Ambulatory Visit: Payer: Self-pay | Admitting: Cardiovascular Disease

## 2014-06-19 DIAGNOSIS — D0430 Carcinoma in situ of skin of unspecified part of face: Secondary | ICD-10-CM | POA: Diagnosis not present

## 2014-06-19 DIAGNOSIS — D0439 Carcinoma in situ of skin of other parts of face: Secondary | ICD-10-CM | POA: Diagnosis not present

## 2014-06-19 DIAGNOSIS — L570 Actinic keratosis: Secondary | ICD-10-CM | POA: Diagnosis not present

## 2014-06-21 ENCOUNTER — Other Ambulatory Visit: Payer: Self-pay | Admitting: Cardiovascular Disease

## 2014-08-07 DIAGNOSIS — Z23 Encounter for immunization: Secondary | ICD-10-CM | POA: Diagnosis not present

## 2014-08-12 ENCOUNTER — Other Ambulatory Visit: Payer: Self-pay | Admitting: Cardiovascular Disease

## 2014-08-29 ENCOUNTER — Other Ambulatory Visit: Payer: Self-pay | Admitting: Cardiovascular Disease

## 2014-09-01 ENCOUNTER — Other Ambulatory Visit: Payer: Self-pay | Admitting: Cardiovascular Disease

## 2014-09-04 DIAGNOSIS — H25043 Posterior subcapsular polar age-related cataract, bilateral: Secondary | ICD-10-CM | POA: Diagnosis not present

## 2014-09-04 DIAGNOSIS — I10 Essential (primary) hypertension: Secondary | ICD-10-CM | POA: Diagnosis not present

## 2014-09-04 DIAGNOSIS — H18413 Arcus senilis, bilateral: Secondary | ICD-10-CM | POA: Diagnosis not present

## 2014-09-04 DIAGNOSIS — H25013 Cortical age-related cataract, bilateral: Secondary | ICD-10-CM | POA: Diagnosis not present

## 2014-09-25 DIAGNOSIS — L570 Actinic keratosis: Secondary | ICD-10-CM | POA: Diagnosis not present

## 2014-09-25 DIAGNOSIS — L728 Other follicular cysts of the skin and subcutaneous tissue: Secondary | ICD-10-CM | POA: Diagnosis not present

## 2014-09-25 DIAGNOSIS — L821 Other seborrheic keratosis: Secondary | ICD-10-CM | POA: Diagnosis not present

## 2014-10-22 ENCOUNTER — Other Ambulatory Visit: Payer: Self-pay | Admitting: Cardiovascular Disease

## 2014-11-10 ENCOUNTER — Other Ambulatory Visit: Payer: Self-pay | Admitting: Cardiovascular Disease

## 2014-11-14 ENCOUNTER — Other Ambulatory Visit: Payer: Self-pay | Admitting: Cardiovascular Disease

## 2014-11-21 DIAGNOSIS — F3181 Bipolar II disorder: Secondary | ICD-10-CM | POA: Diagnosis not present

## 2014-11-30 ENCOUNTER — Encounter: Payer: Self-pay | Admitting: Cardiovascular Disease

## 2014-11-30 ENCOUNTER — Ambulatory Visit (INDEPENDENT_AMBULATORY_CARE_PROVIDER_SITE_OTHER): Payer: Medicare Other | Admitting: Cardiovascular Disease

## 2014-11-30 ENCOUNTER — Other Ambulatory Visit (INDEPENDENT_AMBULATORY_CARE_PROVIDER_SITE_OTHER): Payer: Medicare Other | Admitting: *Deleted

## 2014-11-30 VITALS — BP 106/60 | HR 62 | Ht 69.0 in | Wt 154.8 lb

## 2014-11-30 DIAGNOSIS — I5022 Chronic systolic (congestive) heart failure: Secondary | ICD-10-CM

## 2014-11-30 DIAGNOSIS — I10 Essential (primary) hypertension: Secondary | ICD-10-CM

## 2014-11-30 DIAGNOSIS — E785 Hyperlipidemia, unspecified: Secondary | ICD-10-CM

## 2014-11-30 DIAGNOSIS — F0390 Unspecified dementia without behavioral disturbance: Secondary | ICD-10-CM | POA: Diagnosis not present

## 2014-11-30 LAB — HEPATIC FUNCTION PANEL
ALK PHOS: 52 U/L (ref 39–117)
ALT: 19 U/L (ref 0–53)
AST: 16 U/L (ref 0–37)
Albumin: 4 g/dL (ref 3.5–5.2)
BILIRUBIN DIRECT: 0.1 mg/dL (ref 0.0–0.3)
TOTAL PROTEIN: 6.6 g/dL (ref 6.0–8.3)
Total Bilirubin: 0.6 mg/dL (ref 0.2–1.2)

## 2014-11-30 LAB — LIPID PANEL
Cholesterol: 135 mg/dL (ref 0–200)
HDL: 40.2 mg/dL (ref >39.00)
LDL Cholesterol: 80 mg/dL (ref 0–99)
NONHDL: 94.8
Total CHOL/HDL Ratio: 3
Triglycerides: 76 mg/dL (ref 0.0–149.0)
VLDL: 15.2 mg/dL (ref 0.0–40.0)

## 2014-11-30 LAB — BASIC METABOLIC PANEL
BUN: 25 mg/dL — ABNORMAL HIGH (ref 6–23)
CHLORIDE: 103 meq/L (ref 96–112)
CO2: 33 mEq/L — ABNORMAL HIGH (ref 19–32)
Calcium: 9.3 mg/dL (ref 8.4–10.5)
Creatinine, Ser: 1.5 mg/dL (ref 0.40–1.50)
GFR: 48.59 mL/min — AB (ref >60.00)
Glucose, Bld: 104 mg/dL — ABNORMAL HIGH (ref 70–99)
Potassium: 4.2 mEq/L (ref 3.5–5.1)
Sodium: 141 mEq/L (ref 135–145)

## 2014-11-30 NOTE — Patient Instructions (Signed)
Your physician recommends that you continue on your current medications as directed. Please refer to the Current Medication list given to you today.  Your physician wants you to follow-up in: 6 months with Dr. Nahser.  You will receive a reminder letter in the mail two months in advance. If you don't receive a letter, please call our office to schedule the follow-up appointment.  

## 2014-11-30 NOTE — Progress Notes (Signed)
Cardiology Office Note   Date:  11/30/2014   ID:  Adam Martin, DOB November 11, 1940, MRN [id number]  PCP:  Fae Pippin  Cardiologist:   Tvisha Schwoerer, Wonda Cheng, MD   Chief Complaint  Patient presents with  . Follow-up    CHF   1. Congestive heart failure-EF of 40% 2. Left bundle branch block 3. Dementia 4. Hyperlipidemia 5. Hypertension   History of Present Illness: Adam Martin is a 74 y.o. male who presents for follow up of his CHF Still doing pushups 160 each night.    Walking 1 mile a day No dyspnea, no CP .  No leg edema , He has cut his atorvastatin to 40 ,  Had lipids drawn this am.    Past Medical History  Diagnosis Date  . CHF (congestive heart failure)     EF 40%  . LBBB (left bundle branch block)   . Dementia   . Hyperlipidemia   . Hypertension   . AAA (abdominal aortic aneurysm)   . Breast cancer     Past Surgical History  Procedure Laterality Date  . Cardiac catheterization  05/26/2006    EF 45%  . Mastectomy      LEFT     Current Outpatient Prescriptions  Medication Sig Dispense Refill  . amLODipine (NORVASC) 10 MG tablet TAKE 1 TABLET DAILY (NEED APPOINTMENT, INCREASED DOSE) 90 tablet 0  . aspirin 81 MG tablet Take 81 mg by mouth daily.    [name] atorvastatin (LIPITOR) 80 MG tablet TAKE 1 TABLET DAILY (Patient taking differently: TAKE 1 TABLET DAILY 40 MG) 90 tablet 1  . carvedilol (COREG) 12.5 MG tablet TAKE 1 TABLET TWICE A DAY WITH MEALS (NEED TO CONTACT OFFICE TO SCHEDULE APPOINTMENT FOR FUTURE REFILLS [phone]) 180 tablet 0  . Cholecalciferol (VITAMIN D) 1000 UNITS capsule Take 1,000 Units by mouth daily.      . colchicine 0.6 MG tablet Take 0.6 mg by mouth daily.      [name] donepezil (ARICEPT) 10 MG tablet Take 10 mg by mouth at bedtime.      . fish oil-omega-3 fatty acids 1000 MG capsule Take 1 g by mouth daily.      . hydrALAZINE (APRESOLINE) 25 MG tablet TAKE 1 TABLET THREE TIMES A DAY (Patient taking differently: TAKE 1 TABLET  TWICE DAILY) 270 tablet 1  . hydrochlorothiazide (HYDRODIURIL) 25 MG tablet TAKE 1 TABLET DAILY 90 tablet 0  . KLOR-CON 10 10 MEQ tablet TAKE 1 TABLET DAILY (NEED AN APPOINTMENT) 90 tablet 0  . l-methylfolate-b2-b6-b12 (CEREFOLIN) 03-26-49-5 MG TABS Take 1 tablet by mouth daily.      [name] LORazepam (ATIVAN) 0.5 MG tablet Take 0.5 mg by mouth daily.      . memantine (NAMENDA) 10 MG tablet Take 10 mg by mouth 2 (two) times daily.      . risperiDONE (RISPERDAL) 1 MG tablet Take 1.5 mg by mouth daily.      [name] telmisartan (MICARDIS) 80 MG tablet Take 1 tablet (80 mg total) by mouth daily. 90 tablet 3  . vitamin C (ASCORBIC ACID) 500 MG tablet Take 1,000 mg by mouth daily.      No current facility-administered medications for this visit.    Allergies:   Sulfa drugs cross reactors    Social History:  The patient  reports that he quit smoking about 32 years ago. He does not have any smokeless tobacco history on file. He reports that he does not drink alcohol or  use illicit drugs.   Family History:  The patient's family history includes Alzheimer's disease in his mother; Cancer in his father; Heart attack in his father; Lymphoma in his mother.    ROS:  Please see the history of present illness.    Review of Systems: Constitutional:  denies fever, chills, diaphoresis, appetite change and fatigue.  HEENT: denies photophobia, eye pain, redness, hearing loss, ear pain, congestion, sore throat, rhinorrhea, sneezing, neck pain, neck stiffness and tinnitus.  Respiratory: denies SOB, DOE, cough, chest tightness, and wheezing.  Cardiovascular: denies chest pain, palpitations and leg swelling.  Gastrointestinal: denies nausea, vomiting, abdominal pain, diarrhea, constipation, blood in stool.  Genitourinary: denies dysuria, urgency, frequency, hematuria, flank pain and difficulty urinating.  Musculoskeletal: denies  myalgias, back pain, joint swelling, arthralgias and gait problem.   Skin: denies pallor, rash  and wound.  Neurological: denies dizziness, seizures, syncope, weakness, light-headedness, numbness and headaches.   Hematological: denies adenopathy, easy bruising, personal or family bleeding history.  Psychiatric/ Behavioral: denies suicidal ideation, mood changes, confusion, nervousness, sleep disturbance and agitation.       All other systems are reviewed and negative.    PHYSICAL EXAM: VS:  BP 106/60 mmHg  Pulse 62  Ht 5\' 9"  (1.753 m)  Wt 154 lb 12.8 oz (70.217 kg)  BMI 22.85 kg/m2 , BMI Body mass index is 22.85 kg/(m^2). GEN: Well nourished, well developed, in no acute distress HEENT: normal Neck: no JVD, carotid bruits, or masses Cardiac: RRR; no murmurs, rubs, or gallops,no edema  Respiratory:  clear to auscultation bilaterally, normal work of breathing GI: soft, nontender, nondistended, + BS MS: no deformity or atrophy Skin: warm and dry, no rash Neuro:  Strength and sensation are intact Psych: normal   EKG:  EKG is not ordered today.    Recent Labs: 03/05/2014: ALT 24; BUN 22; Creatinine 1.4; Potassium 3.9; Sodium 140    Lipid Panel    Component Value Date/Time   CHOL 117 03/05/2014 0844   TRIG 50.0 03/05/2014 0844   HDL 41.50 03/05/2014 0844   CHOLHDL 3 03/05/2014 0844   VLDL 10.0 03/05/2014 0844   LDLCALC 66 03/05/2014 0844      Wt Readings from Last 3 Encounters:  11/30/14 154 lb 12.8 oz (70.217 kg)  03/09/14 156 lb (70.761 kg)  08/29/13 159 lb 12.8 oz (72.485 kg)      Other studies Reviewed: Additional studies/ records that were reviewed today include: . Review of the above records demonstrates:    ASSESSMENT AND PLAN:  1. Congestive heart failure-EF of 40% - there is doing very well. Is not having any symptoms of CHF. He remains very active. They walk 1 mile a day and he does 160 pushups every evening.   2. Left bundle branch block- stable  3. Dementia  4. Hyperlipidemia -  - followed by his general medical doctor.  5. Hypertension  - blood pressure is well-controlled. Continue current medications. He avoids extra salt.   Current medicines are reviewed at length with the patient today.  The patient does not have concerns regarding medicines.  The following changes have been made:  no change   Disposition:   FU with me in 6 months.     Signed, Lacreshia Bondarenko, Wonda Cheng, MD  11/30/2014 10:51 AM    [address], [place], [place]  [address] Phone: [phone]; Fax: [phone]

## 2015-01-01 ENCOUNTER — Other Ambulatory Visit: Payer: Self-pay | Admitting: Internal Medicine

## 2015-01-03 ENCOUNTER — Other Ambulatory Visit: Payer: Self-pay | Admitting: Cardiovascular Disease

## 2015-01-22 ENCOUNTER — Other Ambulatory Visit: Payer: Self-pay | Admitting: Cardiovascular Disease

## 2015-01-29 ENCOUNTER — Other Ambulatory Visit: Payer: Self-pay | Admitting: Cardiovascular Disease

## 2015-01-30 DIAGNOSIS — I2510 Atherosclerotic heart disease of native coronary artery without angina pectoris: Secondary | ICD-10-CM | POA: Diagnosis not present

## 2015-01-30 DIAGNOSIS — F0390 Unspecified dementia without behavioral disturbance: Secondary | ICD-10-CM | POA: Diagnosis not present

## 2015-01-30 DIAGNOSIS — Z6822 Body mass index (BMI) 22.0-22.9, adult: Secondary | ICD-10-CM | POA: Diagnosis not present

## 2015-01-30 DIAGNOSIS — Z9181 History of falling: Secondary | ICD-10-CM | POA: Diagnosis not present

## 2015-01-30 DIAGNOSIS — M109 Gout, unspecified: Secondary | ICD-10-CM | POA: Diagnosis not present

## 2015-01-30 DIAGNOSIS — Z1389 Encounter for screening for other disorder: Secondary | ICD-10-CM | POA: Diagnosis not present

## 2015-01-30 DIAGNOSIS — Z125 Encounter for screening for malignant neoplasm of prostate: Secondary | ICD-10-CM | POA: Diagnosis not present

## 2015-01-30 DIAGNOSIS — J069 Acute upper respiratory infection, unspecified: Secondary | ICD-10-CM | POA: Diagnosis not present

## 2015-01-30 DIAGNOSIS — N183 Chronic kidney disease, stage 3 (moderate): Secondary | ICD-10-CM | POA: Diagnosis not present

## 2015-01-30 DIAGNOSIS — I10 Essential (primary) hypertension: Secondary | ICD-10-CM | POA: Diagnosis not present

## 2015-02-14 DIAGNOSIS — D045 Carcinoma in situ of skin of trunk: Secondary | ICD-10-CM | POA: Diagnosis not present

## 2015-03-04 DIAGNOSIS — R972 Elevated prostate specific antigen [PSA]: Secondary | ICD-10-CM | POA: Diagnosis not present

## 2015-03-04 DIAGNOSIS — D649 Anemia, unspecified: Secondary | ICD-10-CM | POA: Diagnosis not present

## 2015-04-03 ENCOUNTER — Other Ambulatory Visit: Payer: Self-pay | Admitting: Cardiovascular Disease

## 2015-04-11 DIAGNOSIS — C44622 Squamous cell carcinoma of skin of right upper limb, including shoulder: Secondary | ICD-10-CM | POA: Diagnosis not present

## 2015-04-11 DIAGNOSIS — L570 Actinic keratosis: Secondary | ICD-10-CM | POA: Diagnosis not present

## 2015-04-11 DIAGNOSIS — C4442 Squamous cell carcinoma of skin of scalp and neck: Secondary | ICD-10-CM | POA: Diagnosis not present

## 2015-04-22 ENCOUNTER — Other Ambulatory Visit: Payer: Self-pay

## 2015-04-23 DIAGNOSIS — H2511 Age-related nuclear cataract, right eye: Secondary | ICD-10-CM | POA: Diagnosis not present

## 2015-04-23 DIAGNOSIS — H02839 Dermatochalasis of unspecified eye, unspecified eyelid: Secondary | ICD-10-CM | POA: Diagnosis not present

## 2015-04-23 DIAGNOSIS — H18413 Arcus senilis, bilateral: Secondary | ICD-10-CM | POA: Diagnosis not present

## 2015-04-23 DIAGNOSIS — I10 Essential (primary) hypertension: Secondary | ICD-10-CM | POA: Diagnosis not present

## 2015-05-08 DIAGNOSIS — F3181 Bipolar II disorder: Secondary | ICD-10-CM | POA: Diagnosis not present

## 2015-06-10 DIAGNOSIS — H25811 Combined forms of age-related cataract, right eye: Secondary | ICD-10-CM | POA: Diagnosis not present

## 2015-06-10 DIAGNOSIS — H2511 Age-related nuclear cataract, right eye: Secondary | ICD-10-CM | POA: Diagnosis not present

## 2015-06-11 DIAGNOSIS — H2512 Age-related nuclear cataract, left eye: Secondary | ICD-10-CM | POA: Diagnosis not present

## 2015-06-11 DIAGNOSIS — H25012 Cortical age-related cataract, left eye: Secondary | ICD-10-CM | POA: Diagnosis not present

## 2015-07-01 ENCOUNTER — Other Ambulatory Visit: Payer: Self-pay | Admitting: Cardiovascular Disease

## 2015-07-03 ENCOUNTER — Encounter: Payer: Self-pay | Admitting: Nurse Practitioner

## 2015-07-03 ENCOUNTER — Telehealth: Payer: Self-pay | Admitting: Cardiovascular Disease

## 2015-07-03 DIAGNOSIS — E785 Hyperlipidemia, unspecified: Secondary | ICD-10-CM

## 2015-07-03 NOTE — Telephone Encounter (Signed)
New message   Pt is requesting labs be ordered before coming to appt

## 2015-07-03 NOTE — Telephone Encounter (Signed)
Spoke with patient's wife and advised her that lab work has been ordered for tomorrow and that she can arrive 20 minutes early for lab draw before appointment.  She verbalized understanding and agreement.

## 2015-07-04 ENCOUNTER — Encounter: Payer: Self-pay | Admitting: Cardiovascular Disease

## 2015-07-04 ENCOUNTER — Other Ambulatory Visit (INDEPENDENT_AMBULATORY_CARE_PROVIDER_SITE_OTHER): Payer: Medicare Other

## 2015-07-04 ENCOUNTER — Ambulatory Visit (INDEPENDENT_AMBULATORY_CARE_PROVIDER_SITE_OTHER): Payer: Medicare Other | Admitting: Cardiovascular Disease

## 2015-07-04 VITALS — BP 116/68 | HR 53 | Ht 70.0 in | Wt 157.0 lb

## 2015-07-04 DIAGNOSIS — E785 Hyperlipidemia, unspecified: Secondary | ICD-10-CM

## 2015-07-04 DIAGNOSIS — I5022 Chronic systolic (congestive) heart failure: Secondary | ICD-10-CM | POA: Diagnosis not present

## 2015-07-04 LAB — LIPID PANEL
Cholesterol: 132 mg/dL (ref 0–200)
HDL: 43.1 mg/dL (ref >39.00)
LDL Cholesterol: 73 mg/dL (ref 0–99)
NONHDL: 89.03
Total CHOL/HDL Ratio: 3
Triglycerides: 79 mg/dL (ref 0.0–149.0)
VLDL: 15.8 mg/dL (ref 0.0–40.0)

## 2015-07-04 LAB — BASIC METABOLIC PANEL
BUN: 22 mg/dL (ref 6–23)
CHLORIDE: 101 meq/L (ref 96–112)
CO2: 31 meq/L (ref 19–32)
Calcium: 9.3 mg/dL (ref 8.4–10.5)
Creatinine, Ser: 1.43 mg/dL (ref 0.40–1.50)
GFR: 51.26 mL/min — ABNORMAL LOW (ref >60.00)
GLUCOSE: 109 mg/dL — AB (ref 70–99)
POTASSIUM: 3.5 meq/L (ref 3.5–5.1)
Sodium: 138 mEq/L (ref 135–145)

## 2015-07-04 LAB — HEPATIC FUNCTION PANEL
ALBUMIN: 4.1 g/dL (ref 3.5–5.2)
ALT: 22 U/L (ref 0–53)
AST: 20 U/L (ref 0–37)
Alkaline Phosphatase: 48 U/L (ref 39–117)
Bilirubin, Direct: 0.1 mg/dL (ref 0.0–0.3)
Total Bilirubin: 0.6 mg/dL (ref 0.2–1.2)
Total Protein: 6.8 g/dL (ref 6.0–8.3)

## 2015-07-04 MED ORDER — ATORVASTATIN CALCIUM 40 MG PO TABS: 40.0000 mg | ORAL_TABLET | Freq: Every day | ORAL | Status: AC

## 2015-07-04 NOTE — Patient Instructions (Signed)

## 2015-07-04 NOTE — Progress Notes (Signed)
Cardiology Office Note   Date:  07/04/2015   ID:  Adam Martin, DOB October 23, 1941, MRN [id number]  PCP:  Fae Pippin  Cardiologist:   Deshawna Mcneece, Wonda Cheng, MD   Chief Complaint  Patient presents with  . Follow-up    chf   1. Congestive heart failure-EF of 40% 2. Left bundle branch block 3. Dementia 4. Hyperlipidemia 5. Hypertension   History of Present Illness: Adam Martin is a 74 y.o. male who presents for follow up of his CHF Still doing pushups 160 each night.    Walking 1 mile a day No dyspnea, no CP .  No leg edema , He has cut his atorvastatin to 40 ,  Had lipids drawn this am.   Sept. 8, 2016:  Doing well Still doing lots of push ups in the afternoon.  No CP or dyspnea. No issues     Past Medical History  Diagnosis Date  . CHF (congestive heart failure)     EF 40%  . LBBB (left bundle branch block)   . Dementia   . Hyperlipidemia   . Hypertension   . AAA (abdominal aortic aneurysm)   . Breast cancer     Past Surgical History  Procedure Laterality Date  . Cardiac catheterization  05/26/2006    EF 45%  . Mastectomy      LEFT     Current Outpatient Prescriptions  Medication Sig Dispense Refill  . amLODipine (NORVASC) 10 MG tablet Take 1 tablet (10 mg total) by mouth daily. 90 tablet 1  . aspirin 81 MG tablet Take 81 mg by mouth daily.    [name] atorvastatin (LIPITOR) 80 MG tablet Take 40 mg by mouth daily.    [name] BESIVANCE 0.6 % SUSP Place 1 drop into the right eye 3 (three) times daily.  1  . carvedilol (COREG) 12.5 MG tablet Take 1 tablet (12.5 mg total) by mouth 2 (two) times daily. 180 tablet 1  . Cholecalciferol (VITAMIN D) 1000 UNITS capsule Take 1,000 Units by mouth daily.      . colchicine 0.6 MG tablet Take 0.6 mg by mouth daily.      [name] donepezil (ARICEPT) 10 MG tablet Take 10 mg by mouth at bedtime.      . fish oil-omega-3 fatty acids 1000 MG capsule Take 1 g by mouth daily.      . hydrALAZINE (APRESOLINE) 25 MG tablet Take 25 mg  by mouth 2 (two) times daily.    . hydrochlorothiazide (HYDRODIURIL) 25 MG tablet Take 25 mg by mouth daily.    [name] l-methylfolate-b2-b6-b12 (CEREFOLIN) 03-26-49-5 MG TABS Take 1 tablet by mouth daily.      [name] LORazepam (ATIVAN) 0.5 MG tablet Take 0.5 mg by mouth daily.      . memantine (NAMENDA) 10 MG tablet Take 10 mg by mouth 2 (two) times daily.      . potassium chloride (KLOR-CON 10) 10 MEQ tablet Take 1 tablet (10 mEq total) by mouth daily. 90 tablet 1  . risperiDONE (RISPERDAL) 1 MG tablet Take 1.5 mg by mouth daily.      [name] telmisartan (MICARDIS) 80 MG tablet Take 80 mg by mouth daily.    . vitamin C (ASCORBIC ACID) 500 MG tablet Take 1,000 mg by mouth daily.      No current facility-administered medications for this visit.    Allergies:   Sulfa drugs cross reactors    Social History:  The patient  reports that he quit smoking about  33 years ago. He does not have any smokeless tobacco history on file. He reports that he does not drink alcohol or use illicit drugs.   Family History:  The patient's family history includes Alzheimer's disease in his mother; Cancer in his father; Heart attack in his father; Lymphoma in his mother.    ROS:  Please see the history of present illness.    Review of Systems: Constitutional:  denies fever, chills, diaphoresis, appetite change and fatigue.  HEENT: denies photophobia, eye pain, redness, hearing loss, ear pain, congestion, sore throat, rhinorrhea, sneezing, neck pain, neck stiffness and tinnitus.  Respiratory: denies SOB, DOE, cough, chest tightness, and wheezing.  Cardiovascular: denies chest pain, palpitations and leg swelling.  Gastrointestinal: denies nausea, vomiting, abdominal pain, diarrhea, constipation, blood in stool.  Genitourinary: denies dysuria, urgency, frequency, hematuria, flank pain and difficulty urinating.  Musculoskeletal: denies  myalgias, back pain, joint swelling, arthralgias and gait problem.   Skin: denies pallor, rash  and wound.  Neurological: denies dizziness, seizures, syncope, weakness, light-headedness, numbness and headaches.   Hematological: denies adenopathy, easy bruising, personal or family bleeding history.  Psychiatric/ Behavioral: denies suicidal ideation, mood changes, confusion, nervousness, sleep disturbance and agitation.       All other systems are reviewed and negative.    PHYSICAL EXAM: VS:  BP 116/68 mmHg  Pulse 53  Ht 5\' 10"  (1.778 m)  Wt 71.215 kg (157 lb)  BMI 22.53 kg/m2 , BMI Body mass index is 22.53 kg/(m^2). GEN: Well nourished, well developed, in no acute distress HEENT: normal Neck: no JVD, carotid bruits, or masses Cardiac: RRR; no murmurs, rubs, or gallops,no edema  Respiratory:  clear to auscultation bilaterally, normal work of breathing GI: soft, nontender, nondistended, + BS MS: no deformity or atrophy Skin: warm and dry, no rash Neuro:  Strength and sensation are intact Psych: normal   EKG:  EKG is ordered today. Sinus bradycardia at 53 bpm. Left bundle branch block.    Recent Labs: 11/30/2014: ALT 19; BUN 25*; Creatinine, Ser 1.50; Potassium 4.2; Sodium 141    Lipid Panel    Component Value Date/Time   CHOL 135 11/30/2014 0828   TRIG 76.0 11/30/2014 0828   HDL 40.20 11/30/2014 0828   CHOLHDL 3 11/30/2014 0828   VLDL 15.2 11/30/2014 0828   LDLCALC 80 11/30/2014 0828      Wt Readings from Last 3 Encounters:  07/04/15 71.215 kg (157 lb)  11/30/14 70.217 kg (154 lb 12.8 oz)  03/09/14 70.761 kg (156 lb)      Other studies Reviewed: Additional studies/ records that were reviewed today include: . Review of the above records demonstrates:    ASSESSMENT AND PLAN:  1. Congestive heart failure-EF of 40% - there is doing very well. Is not having any symptoms of CHF. He remains very active. They walk 1 mile a day and he does 160 pushups every evening.   2. Left bundle branch block- stable  3. Dementia  4. Hyperlipidemia -  - will check  fasting labs today    5. Hypertension - blood pressure is well-controlled. Continue current medications. He avoids extra salt.   Current medicines are reviewed at length with the patient today.  The patient does not have concerns regarding medicines.  The following changes have been made:  no change   Disposition:   FU with me in 6 months.     Signed, Tymon Nemetz, Wonda Cheng, MD  07/04/2015 10:45 AM    [facility] [phone]  [address] [place], [place]  [address] Phone: [phone]; Fax: [phone]

## 2015-07-04 NOTE — Addendum Note (Signed)
Addended by: Freada Bergeron on: 07/04/2015 05:48 PM   Modules accepted: Orders

## 2015-07-08 DIAGNOSIS — H25812 Combined forms of age-related cataract, left eye: Secondary | ICD-10-CM | POA: Diagnosis not present

## 2015-07-08 DIAGNOSIS — H2512 Age-related nuclear cataract, left eye: Secondary | ICD-10-CM | POA: Diagnosis not present

## 2015-07-22 ENCOUNTER — Other Ambulatory Visit: Payer: Self-pay | Admitting: Cardiovascular Disease

## 2015-07-29 ENCOUNTER — Other Ambulatory Visit: Payer: Self-pay | Admitting: Cardiovascular Disease

## 2015-08-07 DIAGNOSIS — L570 Actinic keratosis: Secondary | ICD-10-CM | POA: Diagnosis not present

## 2015-08-08 DIAGNOSIS — Z23 Encounter for immunization: Secondary | ICD-10-CM | POA: Diagnosis not present

## 2015-11-06 ENCOUNTER — Telehealth: Payer: Self-pay | Admitting: Cardiovascular Disease

## 2015-11-06 DIAGNOSIS — E785 Hyperlipidemia, unspecified: Secondary | ICD-10-CM

## 2015-11-06 NOTE — Telephone Encounter (Signed)
New problem    Pt need orders put in for his labs on 3.7.17 that's scheduled.

## 2015-11-06 NOTE — Telephone Encounter (Signed)
Fasting lab orders have been entered and linked to patient's appointment

## 2015-11-20 DIAGNOSIS — F3181 Bipolar II disorder: Secondary | ICD-10-CM | POA: Diagnosis not present

## 2015-12-18 ENCOUNTER — Other Ambulatory Visit: Payer: Self-pay | Admitting: *Deleted

## 2015-12-18 MED ORDER — HYDRALAZINE HCL 25 MG PO TABS: 25.0000 mg | ORAL_TABLET | Freq: Two times a day (BID) | ORAL | Status: DC

## 2015-12-29 ENCOUNTER — Other Ambulatory Visit: Payer: Self-pay | Admitting: Cardiovascular Disease

## 2015-12-31 ENCOUNTER — Other Ambulatory Visit (INDEPENDENT_AMBULATORY_CARE_PROVIDER_SITE_OTHER): Payer: Medicare Other | Admitting: *Deleted

## 2015-12-31 DIAGNOSIS — E785 Hyperlipidemia, unspecified: Secondary | ICD-10-CM | POA: Diagnosis not present

## 2015-12-31 LAB — COMPREHENSIVE METABOLIC PANEL
ALBUMIN: 3.9 g/dL (ref 3.6–5.1)
ALK PHOS: 60 U/L (ref 40–115)
ALT: 22 U/L (ref 9–46)
AST: 17 U/L (ref 10–35)
BUN: 22 mg/dL (ref 7–25)
CHLORIDE: 103 mmol/L (ref 98–110)
CO2: 31 mmol/L (ref 20–31)
CREATININE: 1.54 mg/dL — AB (ref 0.70–1.18)
Calcium: 9.1 mg/dL (ref 8.6–10.3)
Glucose, Bld: 111 mg/dL — ABNORMAL HIGH (ref 65–99)
Potassium: 4.9 mmol/L (ref 3.5–5.3)
SODIUM: 141 mmol/L (ref 135–146)
TOTAL PROTEIN: 6.4 g/dL (ref 6.1–8.1)
Total Bilirubin: 0.6 mg/dL (ref 0.2–1.2)

## 2015-12-31 LAB — LIPID PANEL
Cholesterol: 136 mg/dL (ref 125–200)
HDL: 38 mg/dL — AB (ref >=40)
LDL CALC: 81 mg/dL (ref <130)
TRIGLYCERIDES: 85 mg/dL (ref <150)
Total CHOL/HDL Ratio: 3.6 Ratio (ref <=5.0)
VLDL: 17 mg/dL (ref <30)

## 2016-01-02 ENCOUNTER — Encounter: Payer: Self-pay | Admitting: Cardiovascular Disease

## 2016-01-02 ENCOUNTER — Ambulatory Visit (INDEPENDENT_AMBULATORY_CARE_PROVIDER_SITE_OTHER): Payer: Medicare Other | Admitting: Cardiovascular Disease

## 2016-01-02 VITALS — BP 150/80 | HR 50 | Ht 70.0 in | Wt 162.8 lb

## 2016-01-02 DIAGNOSIS — I5022 Chronic systolic (congestive) heart failure: Secondary | ICD-10-CM

## 2016-01-02 DIAGNOSIS — E785 Hyperlipidemia, unspecified: Secondary | ICD-10-CM | POA: Diagnosis not present

## 2016-01-02 NOTE — Progress Notes (Signed)
Cardiology Office Note   Date:  01/02/2016   ID:  Adam Martin, DOB 20-May-1941, MRN [id number]  PCP:  Adam Martin  Cardiologist:   Adam Martin, Adam Cheng, MD   Chief Complaint  Patient presents with  . Follow-up   1. Congestive heart failure-EF of 40% 2. Left bundle branch block 3. Dementia 4. Hyperlipidemia 5. Hypertension   History of Present Illness: Adam Martin is a 75 y.o. male who presents for follow up of his CHF Still doing pushups 160 each night.    Walking 1 mile a day No dyspnea, no CP .  No leg edema , He has cut his atorvastatin to 40 ,  Had lipids drawn this am.   Sept. 8, 2016:  Doing well Still doing lots of push ups in the afternoon.  No CP or dyspnea. No issues   January 02, 2016:  His wife brought a hand crocheted dish washer today ( brings them twice a year )  Doing well from a cardiac standpoint Wife has been adjusting his meds.  Has cut out vitamines and fish oil     Past Medical History  Diagnosis Date  . CHF (congestive heart failure) (HCC)     EF 40%  . LBBB (left bundle branch block)   . Dementia   . Hyperlipidemia   . Hypertension   . AAA (abdominal aortic aneurysm) ([place])   . Breast cancer [facility])     Past Surgical History  Procedure Laterality Date  . Cardiac catheterization  05/26/2006    EF 45%  . Mastectomy      LEFT     Current Outpatient Prescriptions  Medication Sig Dispense Refill  . amLODipine (NORVASC) 10 MG tablet TAKE 1 TABLET DAILY 90 tablet 3  . aspirin 81 MG tablet Take 81 mg by mouth daily.    [name] atorvastatin (LIPITOR) 40 MG tablet Take 1 tablet (40 mg total) by mouth daily. 90 tablet 3  . carvedilol (COREG) 12.5 MG tablet TAKE 1 TABLET TWICE A DAY 180 tablet 1  . colchicine 0.6 MG tablet Take 0.6 mg by mouth every other day.     . donepezil (ARICEPT) 10 MG tablet Take 10 mg by mouth at bedtime.      . hydrALAZINE (APRESOLINE) 25 MG tablet Take 1 tablet (25 mg total) by mouth 2 (two) times daily.  90 tablet 1  . hydrochlorothiazide (HYDRODIURIL) 25 MG tablet TAKE 1 TABLET DAILY 90 tablet 0  . l-methylfolate-b2-b6-b12 (CEREFOLIN) 03-26-49-5 MG TABS Take 1 tablet by mouth daily.      [name] LORazepam (ATIVAN) 0.5 MG tablet Take 0.5 mg by mouth daily.      . memantine (NAMENDA) 10 MG tablet Take 10 mg by mouth 2 (two) times daily.      . risperiDONE (RISPERDAL) 1 MG tablet Take 1.5 mg by mouth daily.      [name] telmisartan (MICARDIS) 80 MG tablet Take 80 mg by mouth daily.    . vitamin C (ASCORBIC ACID) 500 MG tablet Take 1,000 mg by mouth daily.      No current facility-administered medications for this visit.    Allergies:   Sulfa drugs cross reactors    Social History:  The patient  reports that he quit smoking about 33 years ago. He does not have any smokeless tobacco history on file. He reports that he does not drink alcohol or use illicit drugs.   Family History:  The patient's family history includes Alzheimer's disease in  his mother; Cancer in his father; Heart attack in his father; Lymphoma in his mother.    ROS:  Please see the history of present illness.    Review of Systems: Constitutional:  denies fever, chills, diaphoresis, appetite change and fatigue.  HEENT: denies photophobia, eye pain, redness, hearing loss, ear pain, congestion, sore throat, rhinorrhea, sneezing, neck pain, neck stiffness and tinnitus.  Respiratory: denies SOB, DOE, cough, chest tightness, and wheezing.  Cardiovascular: denies chest pain, palpitations and leg swelling.  Gastrointestinal: denies nausea, vomiting, abdominal pain, diarrhea, constipation, blood in stool.  Genitourinary: denies dysuria, urgency, frequency, hematuria, flank pain and difficulty urinating.  Musculoskeletal: denies  myalgias, back pain, joint swelling, arthralgias and gait problem.   Skin: denies pallor, rash and wound.  Neurological: denies dizziness, seizures, syncope, weakness, light-headedness, numbness and headaches.     Hematological: denies adenopathy, easy bruising, personal or family bleeding history.  Psychiatric/ Behavioral: denies suicidal ideation, mood changes, confusion, nervousness, sleep disturbance and agitation.       All other systems are reviewed and negative.    PHYSICAL EXAM: VS:  BP 150/80 mmHg  Pulse 50  Ht 5\' 10"  (1.778 m)  Wt 162 lb 12.8 oz (73.846 kg)  BMI 23.36 kg/m2 , BMI Body mass index is 23.36 kg/(m^2). GEN: Well nourished, well developed, in no acute distress HEENT: normal Neck: no JVD, carotid bruits, or masses Cardiac: RRR; no murmurs, rubs, or gallops,no edema  Respiratory:  clear to auscultation bilaterally, normal work of breathing GI: soft, nontender, nondistended, + BS MS: no deformity or atrophy Skin: warm and dry, no rash Neuro:  Strength and sensation are intact Psych: normal   EKG:  EKG is ordered today. Sinus bradycardia at 53 bpm. Left bundle branch block.    Recent Labs: 12/31/2015: ALT 22; BUN 22; Creat 1.54*; Potassium 4.9; Sodium 141    Lipid Panel    Component Value Date/Time   CHOL 136 12/31/2015 0752   TRIG 85 12/31/2015 0752   HDL 38* 12/31/2015 0752   CHOLHDL 3.6 12/31/2015 0752   VLDL 17 12/31/2015 0752   LDLCALC 81 12/31/2015 0752      Wt Readings from Last 3 Encounters:  01/02/16 162 lb 12.8 oz (73.846 kg)  07/04/15 157 lb (71.215 kg)  11/30/14 154 lb 12.8 oz (70.217 kg)      Other studies Reviewed: Additional studies/ records that were reviewed today include: . Review of the above records demonstrates:    ASSESSMENT AND PLAN:  1. Congestive heart failure-EF of 40% - there is doing very well. Is not having any symptoms of CHF. He remains very active. They walk 1 mile a day and he does 160 pushups every evening.  Continue current meds  2. Left bundle branch block- stable  3. Dementia  - seems to be stable   4. Hyperlipidemia -  -  Recent labs have been stable   5. Hypertension - blood pressure is  well-controlled. Continue current medications. He avoids extra salt.   Current medicines are reviewed at length with the patient today.  The patient does not have concerns regarding medicines.  The following changes have been made:  no change   Disposition:   FU with me in 6 months.     Signed, Ruthanne Mcneish, Adam Cheng, MD  01/02/2016 12:12 PM    [address], [place], [place]  [address] Phone: [phone]; Fax: [phone]

## 2016-01-02 NOTE — Patient Instructions (Signed)
Medication Instructions:  None  Labwork: None  Testing/Procedures: None  Follow-Up: Your physician wants you to follow-up in: 6 months with Dr. Nahser.  You will receive a reminder letter in the mail two months in advance. If you don't receive a letter, please call our office to schedule the follow-up appointment.   Any Other Special Instructions Will Be Listed Below (If Applicable).     If you need a refill on your cardiac medications before your next appointment, please call your pharmacy.   

## 2016-03-16 ENCOUNTER — Other Ambulatory Visit: Payer: Self-pay | Admitting: Cardiovascular Disease

## 2016-04-09 DIAGNOSIS — L570 Actinic keratosis: Secondary | ICD-10-CM | POA: Diagnosis not present

## 2016-04-09 DIAGNOSIS — C44319 Basal cell carcinoma of skin of other parts of face: Secondary | ICD-10-CM | POA: Diagnosis not present

## 2016-04-24 ENCOUNTER — Other Ambulatory Visit: Payer: Self-pay | Admitting: Cardiovascular Disease

## 2016-05-07 DIAGNOSIS — F3181 Bipolar II disorder: Secondary | ICD-10-CM | POA: Diagnosis not present

## 2016-05-26 ENCOUNTER — Other Ambulatory Visit: Payer: Self-pay | Admitting: Cardiovascular Disease

## 2016-06-24 ENCOUNTER — Other Ambulatory Visit: Payer: Self-pay

## 2016-06-26 ENCOUNTER — Encounter: Payer: Self-pay | Admitting: Cardiovascular Disease

## 2016-07-10 ENCOUNTER — Other Ambulatory Visit: Payer: Self-pay | Admitting: Cardiovascular Disease

## 2016-07-13 ENCOUNTER — Encounter: Payer: Self-pay | Admitting: Cardiovascular Disease

## 2016-07-13 ENCOUNTER — Ambulatory Visit (INDEPENDENT_AMBULATORY_CARE_PROVIDER_SITE_OTHER): Payer: Medicare Other | Admitting: Cardiovascular Disease

## 2016-07-13 VITALS — BP 156/70 | HR 60 | Ht 70.0 in | Wt 160.0 lb

## 2016-07-13 DIAGNOSIS — E785 Hyperlipidemia, unspecified: Secondary | ICD-10-CM

## 2016-07-13 DIAGNOSIS — I447 Left bundle-branch block, unspecified: Secondary | ICD-10-CM

## 2016-07-13 DIAGNOSIS — I10 Essential (primary) hypertension: Secondary | ICD-10-CM | POA: Diagnosis not present

## 2016-07-13 DIAGNOSIS — I5022 Chronic systolic (congestive) heart failure: Secondary | ICD-10-CM | POA: Diagnosis not present

## 2016-07-13 NOTE — Progress Notes (Signed)
Cardiology Office Note   Date:  07/13/2016   ID:  Adam Martin, DOB 05-23-1941, MRN [id number]  PCP:  Cyndi Bender, PA-C  Cardiologist:   Mertie Moores, MD   Chief Complaint  Patient presents with  . Follow-up    CHF   1. Congestive heart failure-EF of 40% 2. Left bundle branch block 3. Dementia 4. Hyperlipidemia 5. Hypertension    Adam Martin is a 75 y.o. male who presents for follow up of his CHF Still doing pushups 160 each night.    Walking 1 mile a day No dyspnea, no CP .  No leg edema , He has cut his atorvastatin to 40 ,  Had lipids drawn this am.   Sept. 8, 2016:  Doing well Still doing lots of push ups in the afternoon.  No CP or dyspnea. No issues   January 02, 2016:  His wife brought a hand crocheted dish washer today (brings them twice a year )  Doing well from a cardiac standpoint Wife has been adjusting his meds.  Has cut out vitamines and fish oil    Sept. 18, 2017:  Adam Martin is seen today for follow up of his chronic systolic CHF. Was seen with his wife Altha Harm today . Still able to do some pushups - not as many as previous Has spent time in the pool this summer.     Past Medical History:  Diagnosis Date  . AAA (abdominal aortic aneurysm) ([place])   . Breast cancer ([place])   . CHF (congestive heart failure) (H[facility])    EF 40%  . Dementia   . Hyperlipidemia   . Hypertension   . LBBB (left bundle branch block)     Past Surgical History:  Procedure Laterality Date  . CARDIAC CATHETERIZATION  05/26/2006   EF 45%  . MASTECTOMY     LEFT     Current Outpatient Prescriptions  Medication Sig Dispense Refill  . amLODipine (NORVASC) 10 MG tablet TAKE 1 TABLET DAILY 90 tablet 3  . aspirin 81 MG tablet Take 81 mg by mouth daily.    [name] atorvastatin (LIPITOR) 40 MG tablet Take 1 tablet (40 mg total) by mouth daily. 90 tablet 3  . carvedilol (COREG) 12.5 MG tablet Take 1 tablet (12.5 mg total) by mouth 2 (two) times daily. 180 tablet 3  .  colchicine 0.6 MG tablet Take 0.6 mg by mouth every other day.     . donepezil (ARICEPT) 10 MG tablet Take 10 mg by mouth at bedtime.      . hydrALAZINE (APRESOLINE) 25 MG tablet TAKE 1 TABLET TWICE A DAY 60 tablet 8  . hydrochlorothiazide (HYDRODIURIL) 25 MG tablet TAKE 1 TABLET DAILY 90 tablet 0  . l-methylfolate-b2-b6-b12 (CEREFOLIN) 03-26-49-5 MG TABS Take 1 tablet by mouth daily.      [name] LORazepam (ATIVAN) 0.5 MG tablet Take 0.5 mg by mouth daily.      . memantine (NAMENDA) 10 MG tablet Take 10 mg by mouth 2 (two) times daily.      . risperiDONE (RISPERDAL) 1 MG tablet Take 1.5 mg by mouth daily.      [name] telmisartan (MICARDIS) 80 MG tablet Take 1 tablet (80 mg total) by mouth daily. 90 tablet 3  . vitamin C (ASCORBIC ACID) 500 MG tablet Take 1,000 mg by mouth daily.      No current facility-administered medications for this visit.     Allergies:   Sulfa drugs cross reactors    Social  History:  The patient  reports that he quit smoking about 34 years ago. He has never used smokeless tobacco. He reports that he does not drink alcohol or use drugs.   Family History:  The patient's family history includes Alzheimer's disease in his mother; Cancer in his father; Heart attack in his father; Lymphoma in his mother.    ROS:  Please see the history of present illness.    Review of Systems: Constitutional:  denies fever, chills, diaphoresis, appetite change and fatigue.  HEENT: denies photophobia, eye pain, redness, hearing loss, ear pain, congestion, sore throat, rhinorrhea, sneezing, neck pain, neck stiffness and tinnitus.  Respiratory: denies SOB, DOE, cough, chest tightness, and wheezing.  Cardiovascular: denies chest pain, palpitations and leg swelling.  Gastrointestinal: denies nausea, vomiting, abdominal pain, diarrhea, constipation, blood in stool.  Genitourinary: denies dysuria, urgency, frequency, hematuria, flank pain and difficulty urinating.  Musculoskeletal: denies  myalgias,  back pain, joint swelling, arthralgias and gait problem.   Skin: denies pallor, rash and wound.  Neurological: denies dizziness, seizures, syncope, weakness, light-headedness, numbness and headaches.   Hematological: denies adenopathy, easy bruising, personal or family bleeding history.  Psychiatric/ Behavioral: denies suicidal ideation, mood changes, confusion, nervousness, sleep disturbance and agitation.       All other systems are reviewed and negative.    PHYSICAL EXAM: VS:  BP (!) 156/70 (BP Location: Left Arm, Patient Position: Sitting, Cuff Size: Normal)   Pulse 60   Ht 5\' 10"  (1.778 m)   Wt 160 lb (72.6 kg)   BMI 22.96 kg/m  , BMI Body mass index is 22.96 kg/m. GEN: Well nourished, well developed, in no acute distress  HEENT: normal  Neck: no JVD, carotid bruits, or masses Cardiac: RRR; no murmurs, rubs, or gallops,no edema  Respiratory:  clear to auscultation bilaterally, normal work of breathing GI: soft, nontender, nondistended, + BS MS: no deformity or atrophy  Skin: warm and dry, no rash Neuro:  Strength and sensation are intact Psych: normal   EKG:  EKG is ordered today. NSR at 60.   LBBB.     Recent Labs: 12/31/2015: ALT 22; BUN 22; Creat 1.54; Potassium 4.9; Sodium 141    Lipid Panel    Component Value Date/Time   CHOL 136 12/31/2015 0752   TRIG 85 12/31/2015 0752   HDL 38 (L) 12/31/2015 0752   CHOLHDL 3.6 12/31/2015 0752   VLDL 17 12/31/2015 0752   LDLCALC 81 12/31/2015 0752      Wt Readings from Last 3 Encounters:  07/13/16 160 lb (72.6 kg)  01/02/16 162 lb 12.8 oz (73.8 kg)  07/04/15 157 lb (71.2 kg)      Other studies Reviewed: Additional studies/ records that were reviewed today include: . Review of the above records demonstrates:    ASSESSMENT AND PLAN:  1. Congestive heart failure-EF of 40% - there is doing very well. Is not having any symptoms of CHF. He remains very active.  His overall condition is gradually deteriating     2. Left bundle branch block- stable  3. Dementia  - seems to be gradually worsening   4. Hyperlipidemia -  -  Recent labs have been stable , will check next office visit   5. Hypertension - blood pressure is well-controlled. Continue current medications. He avoids extra salt.   Current medicines are reviewed at length with the patient today.  The patient does not have concerns regarding medicines.  The following changes have been made:  no change  Disposition:   FU with me in 6 months.     Signed, Mertie Moores, MD  07/13/2016 2:38 PM    [facility] [address], [place], [place]  [address] Phone: [phone]; Fax: [phone]

## 2016-07-13 NOTE — Patient Instructions (Signed)

## 2016-07-16 ENCOUNTER — Other Ambulatory Visit: Payer: Self-pay | Admitting: Cardiovascular Disease

## 2016-07-23 DIAGNOSIS — I2510 Atherosclerotic heart disease of native coronary artery without angina pectoris: Secondary | ICD-10-CM | POA: Diagnosis not present

## 2016-07-23 DIAGNOSIS — Z6822 Body mass index (BMI) 22.0-22.9, adult: Secondary | ICD-10-CM | POA: Diagnosis not present

## 2016-07-23 DIAGNOSIS — Z1389 Encounter for screening for other disorder: Secondary | ICD-10-CM | POA: Diagnosis not present

## 2016-07-23 DIAGNOSIS — Z9181 History of falling: Secondary | ICD-10-CM | POA: Diagnosis not present

## 2016-07-23 DIAGNOSIS — R32 Unspecified urinary incontinence: Secondary | ICD-10-CM | POA: Diagnosis not present

## 2016-07-23 DIAGNOSIS — I10 Essential (primary) hypertension: Secondary | ICD-10-CM | POA: Diagnosis not present

## 2016-07-23 DIAGNOSIS — Z23 Encounter for immunization: Secondary | ICD-10-CM | POA: Diagnosis not present

## 2016-07-23 DIAGNOSIS — F0390 Unspecified dementia without behavioral disturbance: Secondary | ICD-10-CM | POA: Diagnosis not present

## 2016-07-23 DIAGNOSIS — M109 Gout, unspecified: Secondary | ICD-10-CM | POA: Diagnosis not present

## 2016-09-09 DIAGNOSIS — L570 Actinic keratosis: Secondary | ICD-10-CM | POA: Diagnosis not present

## 2016-10-29 DIAGNOSIS — F3181 Bipolar II disorder: Secondary | ICD-10-CM | POA: Diagnosis not present

## 2016-11-17 ENCOUNTER — Other Ambulatory Visit: Payer: Self-pay | Admitting: Physician Assistant

## 2016-11-17 ENCOUNTER — Ambulatory Visit
Admission: RE | Admit: 2016-11-17 | Discharge: 2016-11-17 | Disposition: A | Discharge status: Home / Self Care | Payer: Medicare Other | Source: Ambulatory Visit | Attending: Physician Assistant | Admitting: Physician Assistant

## 2016-11-17 DIAGNOSIS — R600 Localized edema: Secondary | ICD-10-CM | POA: Insufficient documentation

## 2016-11-17 DIAGNOSIS — R609 Edema, unspecified: Secondary | ICD-10-CM | POA: Diagnosis not present

## 2016-11-17 DIAGNOSIS — M7122 Synovial cyst of popliteal space [Baker], left knee: Secondary | ICD-10-CM | POA: Diagnosis not present

## 2016-11-17 DIAGNOSIS — I2510 Atherosclerotic heart disease of native coronary artery without angina pectoris: Secondary | ICD-10-CM | POA: Diagnosis not present

## 2016-11-17 DIAGNOSIS — I10 Essential (primary) hypertension: Secondary | ICD-10-CM | POA: Diagnosis not present

## 2016-11-17 DIAGNOSIS — Z6823 Body mass index (BMI) 23.0-23.9, adult: Secondary | ICD-10-CM | POA: Diagnosis not present

## 2016-11-26 DIAGNOSIS — C44329 Squamous cell carcinoma of skin of other parts of face: Secondary | ICD-10-CM | POA: Diagnosis not present

## 2016-11-30 ENCOUNTER — Other Ambulatory Visit: Payer: Self-pay | Admitting: Cardiovascular Disease

## 2016-12-23 DIAGNOSIS — H6123 Impacted cerumen, bilateral: Secondary | ICD-10-CM | POA: Diagnosis not present

## 2016-12-23 DIAGNOSIS — Z6824 Body mass index (BMI) 24.0-24.9, adult: Secondary | ICD-10-CM | POA: Diagnosis not present

## 2016-12-31 ENCOUNTER — Other Ambulatory Visit: Payer: Medicare Other

## 2017-01-06 ENCOUNTER — Other Ambulatory Visit: Payer: Medicare Other | Admitting: *Deleted

## 2017-01-06 DIAGNOSIS — E785 Hyperlipidemia, unspecified: Secondary | ICD-10-CM

## 2017-01-06 DIAGNOSIS — I5022 Chronic systolic (congestive) heart failure: Secondary | ICD-10-CM

## 2017-01-06 LAB — COMPREHENSIVE METABOLIC PANEL
A/G RATIO: 1.9 (ref 1.2–2.2)
ALBUMIN: 4.3 g/dL (ref 3.5–4.8)
ALT: 33 IU/L (ref 0–44)
AST: 23 IU/L (ref 0–40)
Alkaline Phosphatase: 66 IU/L (ref 39–117)
BUN / CREAT RATIO: 18 (ref 10–24)
BUN: 28 mg/dL — AB (ref 8–27)
Bilirubin Total: 0.4 mg/dL (ref 0.0–1.2)
CALCIUM: 8.9 mg/dL (ref 8.6–10.2)
CO2: 27 mmol/L (ref 18–29)
CREATININE: 1.57 mg/dL — AB (ref 0.76–1.27)
Chloride: 101 mmol/L (ref 96–106)
GFR, EST AFRICAN AMERICAN: 49 mL/min/{1.73_m2} — AB (ref >59)
GFR, EST NON AFRICAN AMERICAN: 42 mL/min/{1.73_m2} — AB (ref >59)
Globulin, Total: 2.3 g/dL (ref 1.5–4.5)
Glucose: 88 mg/dL (ref 65–99)
Potassium: 3.9 mmol/L (ref 3.5–5.2)
Sodium: 143 mmol/L (ref 134–144)
TOTAL PROTEIN: 6.6 g/dL (ref 6.0–8.5)

## 2017-01-06 LAB — LIPID PANEL
CHOL/HDL RATIO: 2.9 ratio (ref 0.0–5.0)
Cholesterol, Total: 145 mg/dL (ref 100–199)
HDL: 50 mg/dL (ref >39)
LDL CALC: 82 mg/dL (ref 0–99)
Triglycerides: 67 mg/dL (ref 0–149)
VLDL CHOLESTEROL CAL: 13 mg/dL (ref 5–40)

## 2017-01-06 NOTE — Addendum Note (Signed)
Addended by: Eulis Foster on: 01/06/2017 07:46 AM   Modules accepted: Orders

## 2017-01-08 ENCOUNTER — Encounter: Payer: Self-pay | Admitting: Cardiovascular Disease

## 2017-01-08 ENCOUNTER — Ambulatory Visit (INDEPENDENT_AMBULATORY_CARE_PROVIDER_SITE_OTHER): Payer: Medicare Other | Admitting: Cardiovascular Disease

## 2017-01-08 VITALS — BP 120/70 | HR 66 | Ht 70.0 in | Wt 174.4 lb

## 2017-01-08 DIAGNOSIS — I5022 Chronic systolic (congestive) heart failure: Secondary | ICD-10-CM

## 2017-01-08 DIAGNOSIS — E782 Mixed hyperlipidemia: Secondary | ICD-10-CM | POA: Diagnosis not present

## 2017-01-08 NOTE — Progress Notes (Signed)
Cardiology Office Note   Date:  01/08/2017   ID:  MOIZ RYANT, DOB 22-May-1941, MRN [id number]  PCP:  Cyndi Bender, PA-C  Cardiologist:   Mertie Moores, MD   Chief Complaint  Patient presents with  . Follow-up    CHF   1. Congestive heart failure-EF of 40% 2. Left bundle branch block 3. Dementia 4. Hyperlipidemia 5. Hypertension    Adam Martin is a 76 y.o. male who presents for follow up of his CHF Still doing pushups 160 each night.    Walking 1 mile a day No dyspnea, no CP .  No leg edema , He has cut his atorvastatin to 40 ,  Had lipids drawn this am.   Sept. 8, 2016:  Doing well Still doing lots of push ups in the afternoon.  No CP or dyspnea. No issues   January 02, 2016:  His wife brought a hand crocheted dish washer today (brings them twice a year )  Doing well from a cardiac standpoint Wife has been adjusting his meds.  Has cut out vitamines and fish oil    Sept. 18, 2017:  Adam Martin is seen today for follow up of his chronic systolic CHF. Was seen with his wife Adam Martin today . Still able to do some pushups - not as many as previous Has spent time in the pool this summer.    January 08, 2017:  Adam Martin is seen today .     Seen with wife Adam Martin and son , Adam Martin.  Has been flying to and from [place]   .   Had some leg swelling Wife decreased the amolodipine  Increased HCTZ   Left leg stayed swollen . Venous duplex showed no thrombi   Is walking every day .   Past Medical History:  Diagnosis Date  . AAA (abdominal aortic aneurysm) ([place])   . Breast cancer ([place])   . CHF (congestive heart failure) (H[facility])    EF 40%  . Dementia   . Hyperlipidemia   . Hypertension   . LBBB (left bundle branch block)     Past Surgical History:  Procedure Laterality Date  . CARDIAC CATHETERIZATION  05/26/2006   EF 45%  . MASTECTOMY     LEFT     Current Outpatient Prescriptions  Medication Sig Dispense Refill  . amLODipine (NORVASC) 10 MG tablet Take  1 tablet (10 mg total) by mouth daily. 90 tablet 3  . aspirin 81 MG tablet Take 81 mg by mouth daily.    [name] atorvastatin (LIPITOR) 40 MG tablet Take 1 tablet (40 mg total) by mouth daily. 90 tablet 3  . carvedilol (COREG) 12.5 MG tablet Take 1 tablet (12.5 mg total) by mouth 2 (two) times daily. 180 tablet 3  . colchicine 0.6 MG tablet Take 0.6 mg by mouth every other day.     . donepezil (ARICEPT) 10 MG tablet Take 10 mg by mouth at bedtime.      . hydrALAZINE (APRESOLINE) 25 MG tablet TAKE 1 TABLET TWICE A DAY 60 tablet 8  . hydrochlorothiazide (HYDRODIURIL) 25 MG tablet TAKE 1 TABLET DAILY 90 tablet 1  . l-methylfolate-b2-b6-b12 (CEREFOLIN) 03-26-49-5 MG TABS Take 1 tablet by mouth daily.      [name] LORazepam (ATIVAN) 0.5 MG tablet Take 0.5 mg by mouth daily.      . memantine (NAMENDA) 10 MG tablet Take 10 mg by mouth 2 (two) times daily.      . risperiDONE (RISPERDAL) 1 MG tablet Take  2 mg by mouth daily.     [name] telmisartan (MICARDIS) 80 MG tablet Take 1 tablet (80 mg total) by mouth daily. (Patient taking differently: Take 40 mg by mouth daily. ) 90 tablet 3   No current facility-administered medications for this visit.     Allergies:   Sulfa drugs cross reactors    Social History:  The patient  reports that he quit smoking about 34 years ago. He has never used smokeless tobacco. He reports that he does not drink alcohol or use drugs.   Family History:  The patient's family history includes Alzheimer's disease in his mother; Cancer in his father; Heart attack in his father; Lymphoma in his mother.    ROS:  Please see the history of present illness.    Review of Systems: Constitutional:  denies fever, chills, diaphoresis, appetite change and fatigue.  HEENT: denies photophobia, eye pain, redness, hearing loss, ear pain, congestion, sore throat, rhinorrhea, sneezing, neck pain, neck stiffness and tinnitus.  Respiratory: denies SOB, DOE, cough, chest tightness, and wheezing.    Cardiovascular: denies chest pain, palpitations and leg swelling.  Gastrointestinal: denies nausea, vomiting, abdominal pain, diarrhea, constipation, blood in stool.  Genitourinary: denies dysuria, urgency, frequency, hematuria, flank pain and difficulty urinating.  Musculoskeletal: denies  myalgias, back pain, joint swelling, arthralgias and gait problem.   Skin: denies pallor, rash and wound.  Neurological: denies dizziness, seizures, syncope, weakness, light-headedness, numbness and headaches.   Hematological: denies adenopathy, easy bruising, personal or family bleeding history.  Psychiatric/ Behavioral: denies suicidal ideation, mood changes, confusion, nervousness, sleep disturbance and agitation.       All other systems are reviewed and negative.    PHYSICAL EXAM: VS:  BP 120/70 (BP Location: Left Arm, Patient Position: Sitting, Cuff Size: Normal)   Pulse 66   Ht 5\' 10"  (1.778 m)   Wt 174 lb 6.4 oz (79.1 kg)   SpO2 98%   BMI 25.02 kg/m  , BMI Body mass index is 25.02 kg/m. GEN: Well nourished, well developed, in no acute distress  HEENT: normal  Neck: no JVD, carotid bruits, or masses Cardiac: RRR; no murmurs, rubs, or gallops, 1-2 + leg  edema  Respiratory:  clear to auscultation bilaterally, normal work of breathing GI: soft, nontender, nondistended, + BS MS: no deformity or atrophy  Skin: warm and dry, no rash Neuro:  Strength and sensation are intact Psych: normal   EKG:  EKG is ordered today. NSR at 60.   LBBB.     Recent Labs: 01/06/2017: ALT 33; BUN 28; Creatinine, Ser 1.57; Potassium 3.9; Sodium 143    Lipid Panel    Component Value Date/Time   CHOL 145 01/06/2017 0746   TRIG 67 01/06/2017 0746   HDL 50 01/06/2017 0746   CHOLHDL 2.9 01/06/2017 0746   CHOLHDL 3.6 12/31/2015 0752   VLDL 17 12/31/2015 0752   LDLCALC 82 01/06/2017 0746      Wt Readings from Last 3 Encounters:  01/08/17 174 lb 6.4 oz (79.1 kg)  07/13/16 160 lb (72.6 kg)   01/02/16 162 lb 12.8 oz (73.8 kg)      Other studies Reviewed: Additional studies/ records that were reviewed today include: . Review of the above records demonstrates:    ASSESSMENT AND PLAN:  1. Congestive heart failure-EF of 40% - there is doing very well. Is not having any symptoms of CHF. He remains very active.  His overall condition is gradually deteriating    2. Left bundle branch block-  stable  3. Dementia  - seems to be gradually worsening   4. Hyperlipidemia -  -  Recent labs have been stable , will check next office visit   5. Hypertension - blood pressure is well-controlled. Continue current medications. He avoids extra salt.  5. Leg edema: He has multiple reasons to have leg edema. He's on amlodipine 10 mg a day. He has chronic systolic congestive heart failure. He spends a lot of his time with his legs down.  I've recommended compression hose and leg elevation. I've recommended ambulation. His wife occasionally will give him an extra HCTZ. I've recommended that she get him a potassium tablet when she does this. We discussed starting him on Lasix but she stated that he may not be able to get to the bathroom in time. He has significant dementia and needs lots of assistance.  Current medicines are reviewed at length with the patient today.  The patient does not have concerns regarding medicines.  The following changes have been made:  no change   Disposition:   FU with me in 6 months.     Signed, Mertie Moores, MD  01/08/2017 1:57 PM    [facility] [address], [place], [place]  [address] Phone: [phone]; Fax: [phone]

## 2017-01-08 NOTE — Patient Instructions (Signed)

## 2017-01-13 DIAGNOSIS — Z6825 Body mass index (BMI) 25.0-25.9, adult: Secondary | ICD-10-CM | POA: Diagnosis not present

## 2017-01-13 DIAGNOSIS — H6980 Other specified disorders of Eustachian tube, unspecified ear: Secondary | ICD-10-CM | POA: Diagnosis not present

## 2017-01-13 DIAGNOSIS — I10 Essential (primary) hypertension: Secondary | ICD-10-CM | POA: Diagnosis not present

## 2017-01-13 DIAGNOSIS — F0390 Unspecified dementia without behavioral disturbance: Secondary | ICD-10-CM | POA: Diagnosis not present

## 2017-01-13 DIAGNOSIS — I2510 Atherosclerotic heart disease of native coronary artery without angina pectoris: Secondary | ICD-10-CM | POA: Diagnosis not present

## 2017-01-25 ENCOUNTER — Other Ambulatory Visit: Payer: Self-pay | Admitting: *Deleted

## 2017-01-25 MED ORDER — HYDRALAZINE HCL 25 MG PO TABS: 25.0000 mg | ORAL_TABLET | Freq: Two times a day (BID) | ORAL | 3 refills | Status: DC

## 2017-02-04 DIAGNOSIS — L570 Actinic keratosis: Secondary | ICD-10-CM | POA: Diagnosis not present

## 2017-04-07 DIAGNOSIS — C44319 Basal cell carcinoma of skin of other parts of face: Secondary | ICD-10-CM | POA: Diagnosis not present

## 2017-04-09 DIAGNOSIS — F3181 Bipolar II disorder: Secondary | ICD-10-CM | POA: Diagnosis not present

## 2017-04-15 DIAGNOSIS — Z6824 Body mass index (BMI) 24.0-24.9, adult: Secondary | ICD-10-CM | POA: Diagnosis not present

## 2017-04-15 DIAGNOSIS — I10 Essential (primary) hypertension: Secondary | ICD-10-CM | POA: Diagnosis not present

## 2017-04-15 DIAGNOSIS — M109 Gout, unspecified: Secondary | ICD-10-CM | POA: Diagnosis not present

## 2017-04-15 DIAGNOSIS — R32 Unspecified urinary incontinence: Secondary | ICD-10-CM | POA: Diagnosis not present

## 2017-04-15 DIAGNOSIS — F0390 Unspecified dementia without behavioral disturbance: Secondary | ICD-10-CM | POA: Diagnosis not present

## 2017-04-15 DIAGNOSIS — R410 Disorientation, unspecified: Secondary | ICD-10-CM | POA: Diagnosis not present

## 2017-04-19 ENCOUNTER — Other Ambulatory Visit: Payer: Self-pay | Admitting: Cardiovascular Disease

## 2017-04-27 DIAGNOSIS — G309 Alzheimer's disease, unspecified: Secondary | ICD-10-CM | POA: Diagnosis not present

## 2017-05-11 DIAGNOSIS — F3181 Bipolar II disorder: Secondary | ICD-10-CM | POA: Diagnosis not present

## 2017-05-13 ENCOUNTER — Other Ambulatory Visit: Payer: Self-pay | Admitting: Cardiovascular Disease

## 2017-06-02 DIAGNOSIS — G309 Alzheimer's disease, unspecified: Secondary | ICD-10-CM | POA: Diagnosis not present

## 2017-07-01 DIAGNOSIS — F0390 Unspecified dementia without behavioral disturbance: Secondary | ICD-10-CM | POA: Diagnosis not present

## 2017-07-01 DIAGNOSIS — H6123 Impacted cerumen, bilateral: Secondary | ICD-10-CM | POA: Diagnosis not present

## 2017-07-01 DIAGNOSIS — Z6824 Body mass index (BMI) 24.0-24.9, adult: Secondary | ICD-10-CM | POA: Diagnosis not present

## 2017-07-06 DIAGNOSIS — G309 Alzheimer's disease, unspecified: Secondary | ICD-10-CM | POA: Diagnosis not present

## 2017-07-13 DIAGNOSIS — H26493 Other secondary cataract, bilateral: Secondary | ICD-10-CM | POA: Diagnosis not present

## 2017-07-21 DIAGNOSIS — C44229 Squamous cell carcinoma of skin of left ear and external auricular canal: Secondary | ICD-10-CM | POA: Diagnosis not present

## 2017-07-21 DIAGNOSIS — C44329 Squamous cell carcinoma of skin of other parts of face: Secondary | ICD-10-CM | POA: Diagnosis not present

## 2017-07-21 DIAGNOSIS — L570 Actinic keratosis: Secondary | ICD-10-CM | POA: Diagnosis not present

## 2017-07-23 DIAGNOSIS — Z136 Encounter for screening for cardiovascular disorders: Secondary | ICD-10-CM | POA: Diagnosis not present

## 2017-07-23 DIAGNOSIS — Z0000 Encounter for general adult medical examination without abnormal findings: Secondary | ICD-10-CM | POA: Diagnosis not present

## 2017-07-23 DIAGNOSIS — Z125 Encounter for screening for malignant neoplasm of prostate: Secondary | ICD-10-CM | POA: Diagnosis not present

## 2017-07-23 DIAGNOSIS — Z9181 History of falling: Secondary | ICD-10-CM | POA: Diagnosis not present

## 2017-07-23 DIAGNOSIS — Z23 Encounter for immunization: Secondary | ICD-10-CM | POA: Diagnosis not present

## 2017-07-23 DIAGNOSIS — E785 Hyperlipidemia, unspecified: Secondary | ICD-10-CM | POA: Diagnosis not present

## 2017-07-27 DIAGNOSIS — M109 Gout, unspecified: Secondary | ICD-10-CM | POA: Diagnosis not present

## 2017-07-27 DIAGNOSIS — I10 Essential (primary) hypertension: Secondary | ICD-10-CM | POA: Diagnosis not present

## 2017-07-27 DIAGNOSIS — Z79899 Other long term (current) drug therapy: Secondary | ICD-10-CM | POA: Diagnosis not present

## 2017-07-27 DIAGNOSIS — I509 Heart failure, unspecified: Secondary | ICD-10-CM | POA: Diagnosis not present

## 2017-07-27 DIAGNOSIS — Z6824 Body mass index (BMI) 24.0-24.9, adult: Secondary | ICD-10-CM | POA: Diagnosis not present

## 2017-07-27 DIAGNOSIS — I2510 Atherosclerotic heart disease of native coronary artery without angina pectoris: Secondary | ICD-10-CM | POA: Diagnosis not present

## 2017-07-27 DIAGNOSIS — F0390 Unspecified dementia without behavioral disturbance: Secondary | ICD-10-CM | POA: Diagnosis not present

## 2017-07-27 DIAGNOSIS — F419 Anxiety disorder, unspecified: Secondary | ICD-10-CM | POA: Diagnosis not present

## 2017-08-04 ENCOUNTER — Other Ambulatory Visit: Payer: Self-pay | Admitting: Cardiovascular Disease

## 2017-08-06 DIAGNOSIS — F3181 Bipolar II disorder: Secondary | ICD-10-CM | POA: Diagnosis not present

## 2017-08-18 DIAGNOSIS — G309 Alzheimer's disease, unspecified: Secondary | ICD-10-CM | POA: Diagnosis not present

## 2017-09-20 DIAGNOSIS — G309 Alzheimer's disease, unspecified: Secondary | ICD-10-CM | POA: Diagnosis not present

## 2017-09-21 DIAGNOSIS — Z6825 Body mass index (BMI) 25.0-25.9, adult: Secondary | ICD-10-CM | POA: Diagnosis not present

## 2017-09-21 DIAGNOSIS — J309 Allergic rhinitis, unspecified: Secondary | ICD-10-CM | POA: Diagnosis not present

## 2017-09-28 DIAGNOSIS — L02811 Cutaneous abscess of head [any part, except face]: Secondary | ICD-10-CM | POA: Diagnosis not present

## 2017-11-02 DIAGNOSIS — G309 Alzheimer's disease, unspecified: Secondary | ICD-10-CM | POA: Diagnosis not present

## 2017-11-10 ENCOUNTER — Ambulatory Visit (INDEPENDENT_AMBULATORY_CARE_PROVIDER_SITE_OTHER): Payer: Medicare Other | Admitting: Cardiovascular Disease

## 2017-11-10 ENCOUNTER — Encounter: Payer: Self-pay | Admitting: Cardiovascular Disease

## 2017-11-10 VITALS — BP 152/74 | HR 62 | Ht 70.0 in | Wt 174.0 lb

## 2017-11-10 DIAGNOSIS — I10 Essential (primary) hypertension: Secondary | ICD-10-CM | POA: Diagnosis not present

## 2017-11-10 DIAGNOSIS — I5022 Chronic systolic (congestive) heart failure: Secondary | ICD-10-CM | POA: Diagnosis not present

## 2017-11-10 NOTE — Patient Instructions (Signed)

## 2017-11-10 NOTE — Progress Notes (Signed)
Cardiology Office Note   Date:  11/10/2017   ID:  GARNETT NUNZIATA, DOB 1941/08/31, MRN [id number]  PCP:  Cyndi Bender, PA-C  Cardiologist:   Mertie Moores, MD   Chief Complaint  Patient presents with  . Follow-up    CHF   1. Congestive heart failure-EF of 40% 2. Left bundle branch block 3. Dementia 4. Hyperlipidemia 5. Hypertension    Adam Martin is a 77 y.o. male who presents for follow up of his CHF Still doing pushups 160 each night.    Walking 1 mile a day No dyspnea, no CP .  No leg edema , He has cut his atorvastatin to 40 ,  Had lipids drawn this am.   Sept. 8, 2016:  Doing well Still doing lots of push ups in the afternoon.  No CP or dyspnea. No issues   January 02, 2016:  His wife brought a hand crocheted dish washer today (brings them twice a year )  Doing well from a cardiac standpoint Wife has been adjusting his meds.  Has cut out vitamines and fish oil    Sept. 18, 2017:  Jasper is seen today for follow up of his chronic systolic CHF. Was seen with his wife Altha Harm today . Still able to do some pushups - not as many as previous Has spent time in the pool this summer.    January 08, 2017:  Bandon is seen today .     Seen with wife Altha Harm and son , Cecilie Lowers.  Has been flying to and from [place]   .   Had some leg swelling Wife decreased the amolodipine  Increased HCTZ   Left leg stayed swollen . Venous duplex showed no thrombi   Is walking every day .   Jan. 16, 2019 Doing ok Walks with his wife occasionally .   Bounces the basketball back and forth with his wife . No CP or dyspnea.  Perhaps a bit more winded at times.    Past Medical History:  Diagnosis Date  . AAA (abdominal aortic aneurysm) ([place])   . Breast cancer ([place])   . CHF (congestive heart failure) ([facility])    EF 40%  . Dementia   . Hyperlipidemia   . Hypertension   . LBBB (left bundle branch block)     Past Surgical History:  Procedure Laterality Date  . CARDIAC  CATHETERIZATION  05/26/2006   EF 45%  . MASTECTOMY     LEFT     Current Outpatient Medications  Medication Sig Dispense Refill  . amLODipine (NORVASC) 10 MG tablet Take 1 tablet (10 mg total) by mouth daily. 90 tablet 3  . aspirin 81 MG tablet Take 81 mg by mouth daily.    [name] atorvastatin (LIPITOR) 40 MG tablet Take 1 tablet (40 mg total) by mouth daily. 90 tablet 3  . carvedilol (COREG) 12.5 MG tablet Take 1 tablet (12.5 mg total) by mouth 2 (two) times daily. 180 tablet 2  . colchicine 0.6 MG tablet Take 0.6 mg by mouth every other day.     . donepezil (ARICEPT) 10 MG tablet Take 10 mg by mouth at bedtime.      . hydrALAZINE (APRESOLINE) 25 MG tablet Take 1 tablet (25 mg total) by mouth 2 (two) times daily. 180 tablet 3  . hydrochlorothiazide (HYDRODIURIL) 25 MG tablet TAKE 1 TABLET DAILY 90 tablet 2  . LORazepam (ATIVAN) 0.5 MG tablet Take 0.5 mg by mouth daily.      [name]  memantine (NAMENDA) 10 MG tablet Take 10 mg by mouth 2 (two) times daily.      . risperiDONE (RISPERDAL) 1 MG tablet Take 4 mg by mouth daily.     [name] telmisartan (MICARDIS) 80 MG tablet Take 1 tablet (80 mg total) by mouth daily. 90 tablet 1   No current facility-administered medications for this visit.     Allergies:   Sulfa drugs cross reactors    Social History:  The patient  reports that he quit smoking about 35 years ago. he has never used smokeless tobacco. He reports that he does not drink alcohol or use drugs.   Family History:  The patient's family history includes Alzheimer's disease in his mother; Cancer in his father; Heart attack in his father; Lymphoma in his mother.    ROS: Noted in current history.  Otherwise systems are negative.    Physical Exam: Blood pressure (!) 152/74, pulse 62, height 5\' 10"  (1.778 m), weight 174 lb (78.9 kg), SpO2 97 %.  GEN:  Well nourished, well developed in no acute distress HEENT: Normal NECK: No JVD; No carotid bruits LYMPHATICS: No lymphadenopathy CARDIAC:  RR ,     RESPIRATORY:  Clear to auscultation without rales, wheezing or rhonchi  ABDOMEN: Soft, non-tender, non-distended MUSCULOSKELETAL:  No edema; No deformity  SKIN: Warm and dry NEUROLOGIC:  Alert and oriented x 3   EKG:   November 10, 2017: Normal sinus rhythm at 62.  Left bundle branch block    Recent Labs: 01/06/2017: ALT 33; BUN 28; Creatinine, Ser 1.57; Potassium 3.9; Sodium 143    Lipid Panel    Component Value Date/Time   CHOL 145 01/06/2017 0746   TRIG 67 01/06/2017 0746   HDL 50 01/06/2017 0746   CHOLHDL 2.9 01/06/2017 0746   CHOLHDL 3.6 12/31/2015 0752   VLDL 17 12/31/2015 0752   LDLCALC 82 01/06/2017 0746      Wt Readings from Last 3 Encounters:  11/10/17 174 lb (78.9 kg)  01/08/17 174 lb 6.4 oz (79.1 kg)  07/13/16 160 lb (72.6 kg)      Other studies Reviewed: Additional studies/ records that were reviewed today include: . Review of the above records demonstrates:    ASSESSMENT AND PLAN:  1. Congestive heart failure-EF of 40% -    Stable He has severe dementia.   We will continue to have a very non-aggressive approach to his CHF treatment   2. Left bundle branch block- stable  3. Dementia  -  progressive  4. Hyperlipidemia -  -   Managed by his primary   5. Hypertension -   typ8cally well controlled.   5. Leg edema:   - mild, liekly related to high dose amlodipine      Current medicines are reviewed at length with the patient today.  The patient does not have concerns regarding medicines.  The following changes have been made:  no change   Disposition:   FU with me in 1 year . [name]     Signed, Mertie Moores, MD  11/10/2017 3:25 PM    [facility] [place], [place], [place]  [address] Phone: [phone]; Fax: [phone]

## 2017-11-23 DIAGNOSIS — F3181 Bipolar II disorder: Secondary | ICD-10-CM | POA: Diagnosis not present

## 2017-11-29 DIAGNOSIS — Z6825 Body mass index (BMI) 25.0-25.9, adult: Secondary | ICD-10-CM | POA: Diagnosis not present

## 2017-11-29 DIAGNOSIS — J189 Pneumonia, unspecified organism: Secondary | ICD-10-CM | POA: Diagnosis not present

## 2017-12-08 DIAGNOSIS — G309 Alzheimer's disease, unspecified: Secondary | ICD-10-CM | POA: Diagnosis not present

## 2017-12-28 DIAGNOSIS — D485 Neoplasm of uncertain behavior of skin: Secondary | ICD-10-CM | POA: Diagnosis not present

## 2017-12-28 DIAGNOSIS — C4442 Squamous cell carcinoma of skin of scalp and neck: Secondary | ICD-10-CM | POA: Diagnosis not present

## 2018-01-03 DIAGNOSIS — F0390 Unspecified dementia without behavioral disturbance: Secondary | ICD-10-CM | POA: Diagnosis not present

## 2018-01-03 DIAGNOSIS — R350 Frequency of micturition: Secondary | ICD-10-CM | POA: Diagnosis not present

## 2018-01-03 DIAGNOSIS — I10 Essential (primary) hypertension: Secondary | ICD-10-CM | POA: Diagnosis not present

## 2018-01-26 DIAGNOSIS — Z6824 Body mass index (BMI) 24.0-24.9, adult: Secondary | ICD-10-CM | POA: Diagnosis not present

## 2018-01-26 DIAGNOSIS — H1030 Unspecified acute conjunctivitis, unspecified eye: Secondary | ICD-10-CM | POA: Diagnosis not present

## 2018-01-26 DIAGNOSIS — I10 Essential (primary) hypertension: Secondary | ICD-10-CM | POA: Diagnosis not present

## 2018-02-09 DIAGNOSIS — G309 Alzheimer's disease, unspecified: Secondary | ICD-10-CM | POA: Diagnosis not present

## 2018-02-16 DIAGNOSIS — C44329 Squamous cell carcinoma of skin of other parts of face: Secondary | ICD-10-CM | POA: Diagnosis not present

## 2018-02-16 DIAGNOSIS — C4442 Squamous cell carcinoma of skin of scalp and neck: Secondary | ICD-10-CM | POA: Diagnosis not present

## 2018-02-16 DIAGNOSIS — F3181 Bipolar II disorder: Secondary | ICD-10-CM | POA: Diagnosis not present

## 2018-02-18 ENCOUNTER — Other Ambulatory Visit: Payer: Self-pay | Admitting: Cardiovascular Disease

## 2018-02-22 ENCOUNTER — Other Ambulatory Visit: Payer: Self-pay | Admitting: Cardiovascular Disease

## 2018-02-27 IMAGING — US US EXTREM LOW VENOUS*L*
1 series · 13 of 24 positions shown · non-contrast
Comparison: None.

CLINICAL DATA: Left lower extremity edema, recent travel 1 month
ago



[Series 1: us extrem low venous*left* · 0.08mm/px · 13 of 43 slices shown]
[im 1/43]
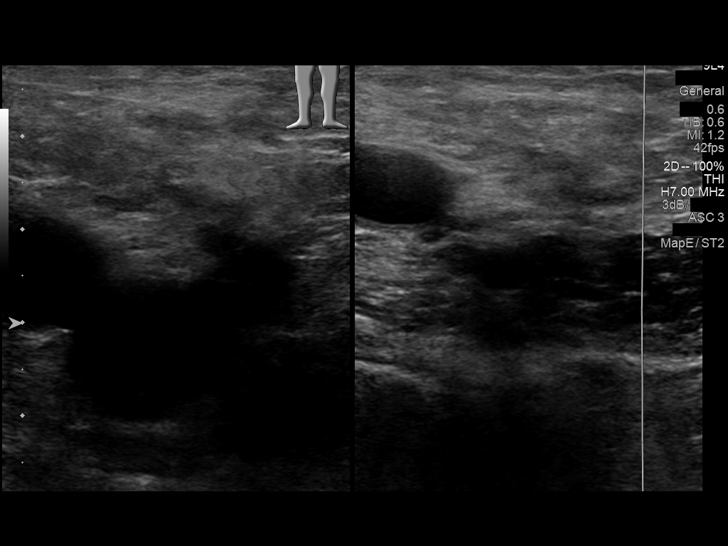
[im 4/43]
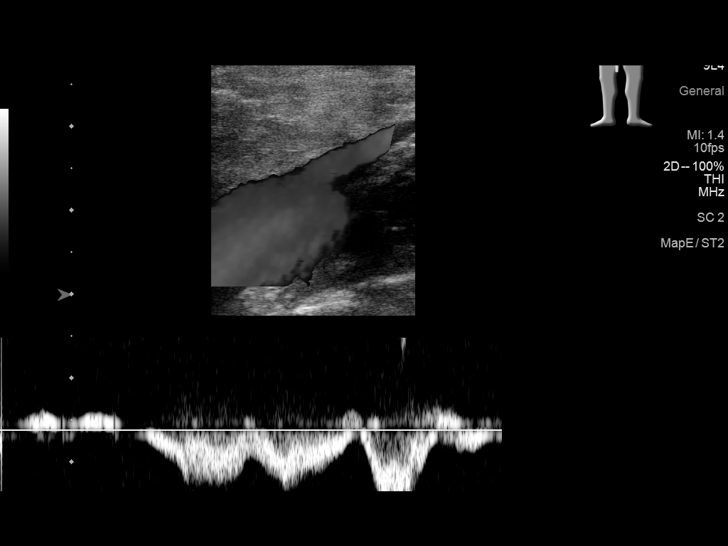
[im 8/43]
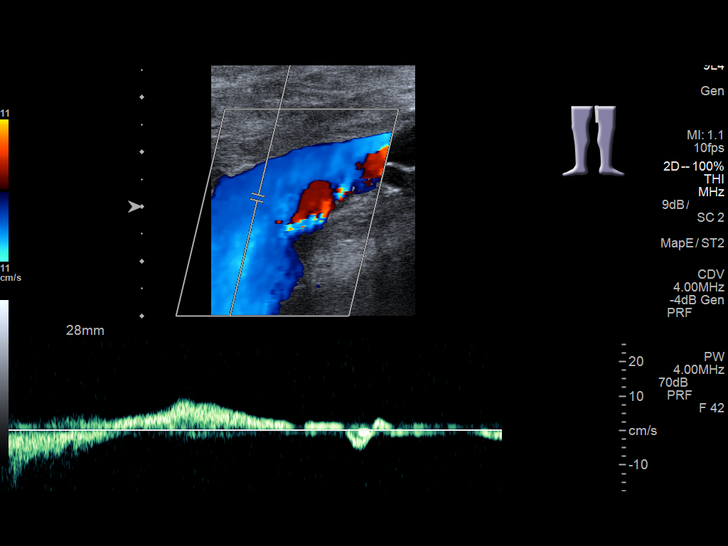
[im 11/43]
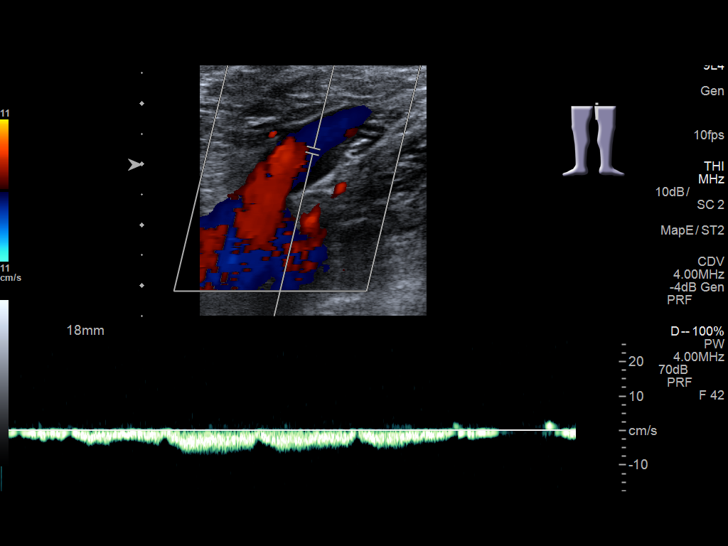
[im 15/43]
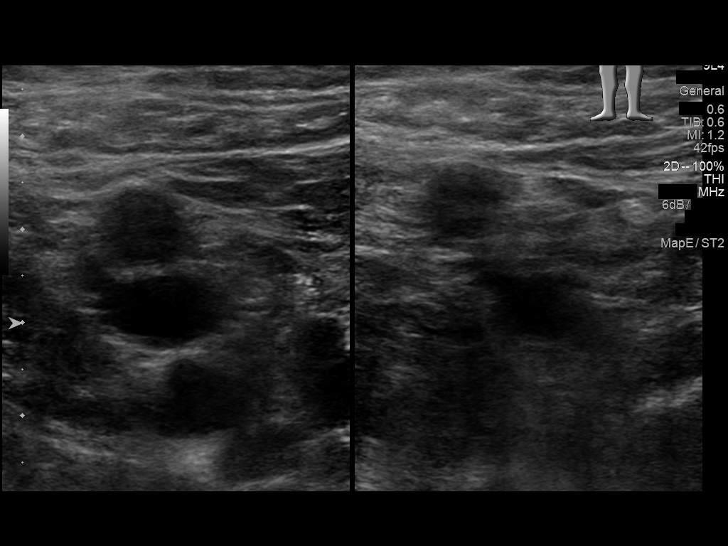
[im 19/43]
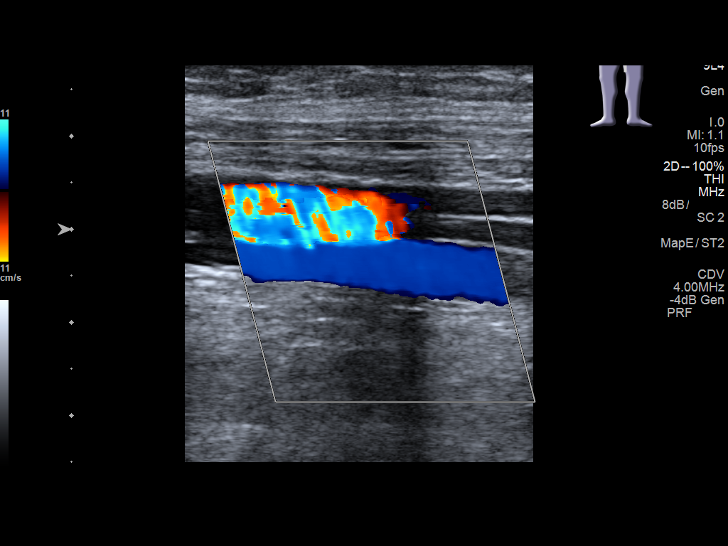
[im 22/43]
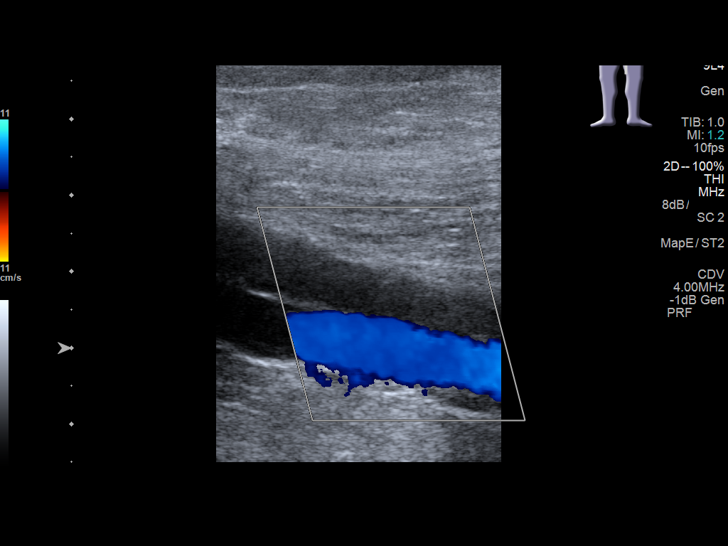
[im 24/43]
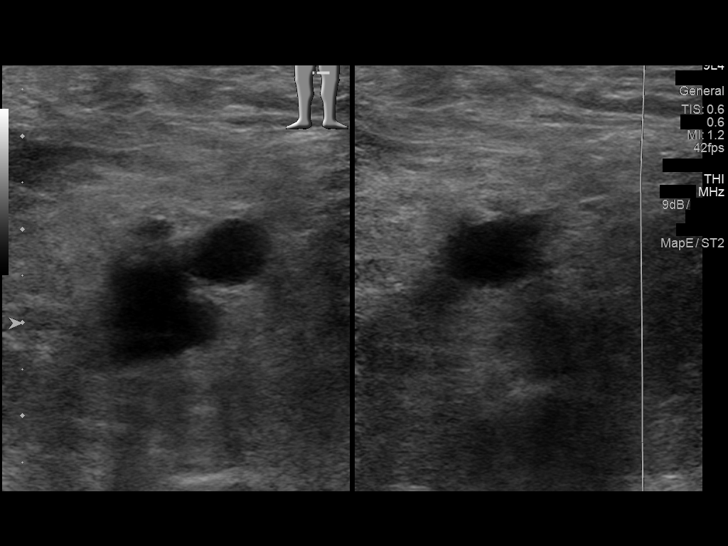
[im 28/43]
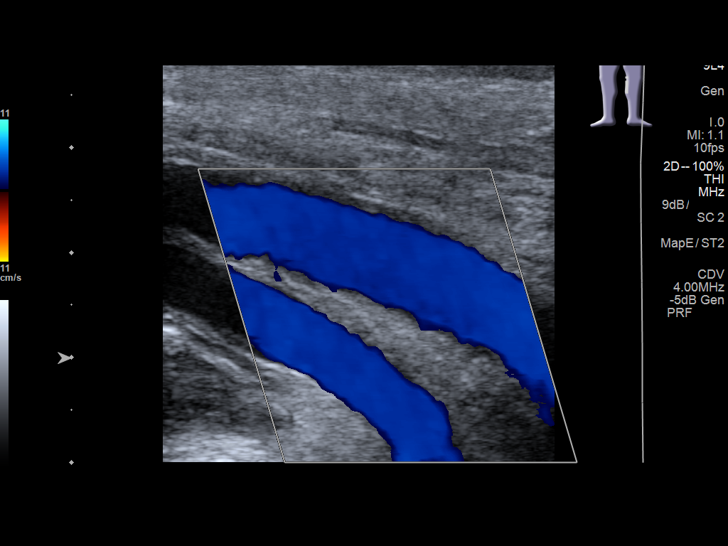
[im 32/43]
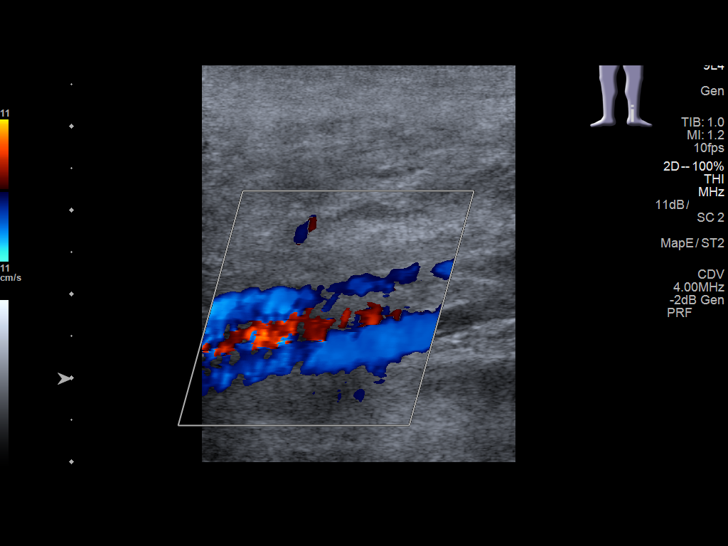
[im 35/43]
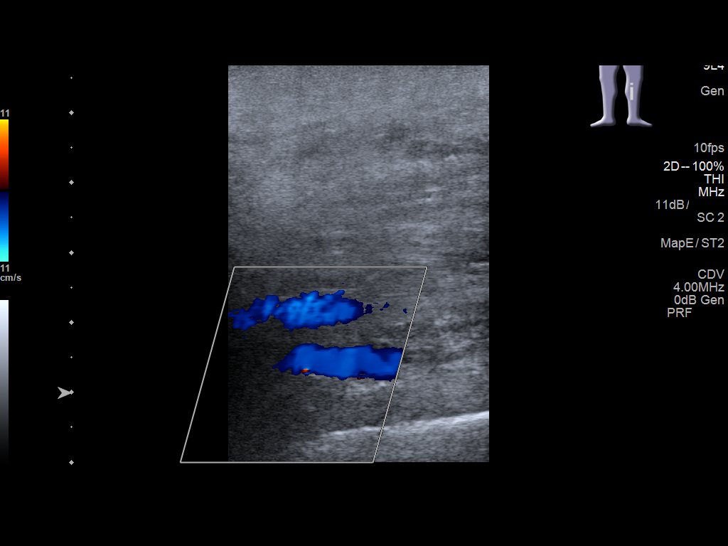
[im 39/43]
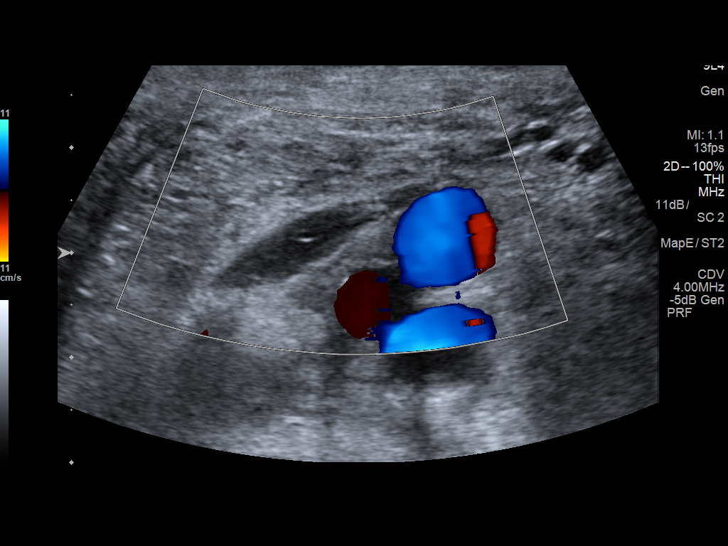
[im 43/43]
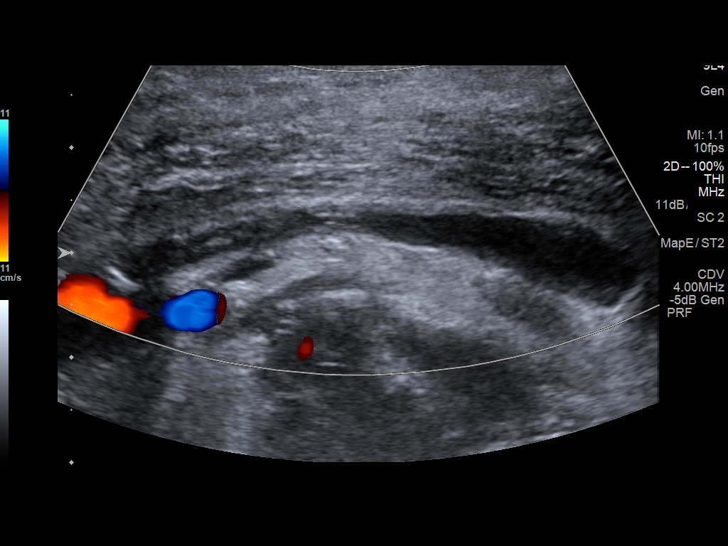

[13 of 24 positions shown; findings below may reference images not displayed]

FINDINGS: Contralateral Common Femoral Vein: Respiratory phasicity is normal
and symmetric with the symptomatic side. No evidence of thrombus.
Normal compressibility.

Common Femoral Vein: No evidence of thrombus. Normal
compressibility, respiratory phasicity and response to augmentation.

Saphenofemoral Junction: No evidence of thrombus. Normal
compressibility and flow on color Doppler imaging.

Profunda Femoral Vein: No evidence of thrombus. Normal
compressibility and flow on color Doppler imaging.

Femoral Vein: No evidence of thrombus. Normal compressibility,
respiratory phasicity and response to augmentation.

Popliteal Vein: No evidence of thrombus. Normal compressibility,
respiratory phasicity and response to augmentation.

Calf Veins: No evidence of thrombus. Normal compressibility and flow
on color Doppler imaging.

Superficial Great Saphenous Vein: No evidence of thrombus. Normal
compressibility and flow on color Doppler imaging.

Venous Reflux:  None.

Other Findings: Left popliteal fossa collapsed Baker's cyst measures
4.9 x 0.5 x 2.1 cm.
IMPRESSION: Negative for left lower extremity DVT.

Left popliteal Baker's cyst as above

## 2018-03-09 DIAGNOSIS — M170 Bilateral primary osteoarthritis of knee: Secondary | ICD-10-CM | POA: Diagnosis not present

## 2018-03-11 DIAGNOSIS — F419 Anxiety disorder, unspecified: Secondary | ICD-10-CM | POA: Diagnosis not present

## 2018-03-11 DIAGNOSIS — Z79899 Other long term (current) drug therapy: Secondary | ICD-10-CM | POA: Diagnosis not present

## 2018-03-11 DIAGNOSIS — F0390 Unspecified dementia without behavioral disturbance: Secondary | ICD-10-CM | POA: Diagnosis not present

## 2018-03-11 DIAGNOSIS — R531 Weakness: Secondary | ICD-10-CM | POA: Diagnosis not present

## 2018-03-11 DIAGNOSIS — R269 Unspecified abnormalities of gait and mobility: Secondary | ICD-10-CM | POA: Diagnosis not present

## 2018-03-25 DIAGNOSIS — R269 Unspecified abnormalities of gait and mobility: Secondary | ICD-10-CM | POA: Diagnosis not present

## 2018-03-25 DIAGNOSIS — F0390 Unspecified dementia without behavioral disturbance: Secondary | ICD-10-CM | POA: Diagnosis not present

## 2018-03-25 DIAGNOSIS — I10 Essential (primary) hypertension: Secondary | ICD-10-CM | POA: Diagnosis not present

## 2018-03-25 DIAGNOSIS — I509 Heart failure, unspecified: Secondary | ICD-10-CM | POA: Diagnosis not present

## 2018-03-30 DIAGNOSIS — G309 Alzheimer's disease, unspecified: Secondary | ICD-10-CM | POA: Diagnosis not present

## 2018-04-01 DIAGNOSIS — N183 Chronic kidney disease, stage 3 (moderate): Secondary | ICD-10-CM | POA: Diagnosis not present

## 2018-04-01 DIAGNOSIS — G309 Alzheimer's disease, unspecified: Secondary | ICD-10-CM | POA: Diagnosis not present

## 2018-04-01 DIAGNOSIS — M170 Bilateral primary osteoarthritis of knee: Secondary | ICD-10-CM | POA: Diagnosis not present

## 2018-04-01 DIAGNOSIS — I130 Hypertensive heart and chronic kidney disease with heart failure and stage 1 through stage 4 chronic kidney disease, or unspecified chronic kidney disease: Secondary | ICD-10-CM | POA: Diagnosis not present

## 2018-04-01 DIAGNOSIS — I5020 Unspecified systolic (congestive) heart failure: Secondary | ICD-10-CM | POA: Diagnosis not present

## 2018-04-01 DIAGNOSIS — F0280 Dementia in other diseases classified elsewhere without behavioral disturbance: Secondary | ICD-10-CM | POA: Diagnosis not present

## 2018-04-04 DIAGNOSIS — D2239 Melanocytic nevi of other parts of face: Secondary | ICD-10-CM | POA: Diagnosis not present

## 2018-04-04 DIAGNOSIS — L570 Actinic keratosis: Secondary | ICD-10-CM | POA: Diagnosis not present

## 2018-04-04 DIAGNOSIS — L820 Inflamed seborrheic keratosis: Secondary | ICD-10-CM | POA: Diagnosis not present

## 2018-04-06 DIAGNOSIS — I5020 Unspecified systolic (congestive) heart failure: Secondary | ICD-10-CM | POA: Diagnosis not present

## 2018-04-06 DIAGNOSIS — F0280 Dementia in other diseases classified elsewhere without behavioral disturbance: Secondary | ICD-10-CM | POA: Diagnosis not present

## 2018-04-06 DIAGNOSIS — I130 Hypertensive heart and chronic kidney disease with heart failure and stage 1 through stage 4 chronic kidney disease, or unspecified chronic kidney disease: Secondary | ICD-10-CM | POA: Diagnosis not present

## 2018-04-06 DIAGNOSIS — G309 Alzheimer's disease, unspecified: Secondary | ICD-10-CM | POA: Diagnosis not present

## 2018-04-06 DIAGNOSIS — N183 Chronic kidney disease, stage 3 (moderate): Secondary | ICD-10-CM | POA: Diagnosis not present

## 2018-04-06 DIAGNOSIS — M170 Bilateral primary osteoarthritis of knee: Secondary | ICD-10-CM | POA: Diagnosis not present

## 2018-04-08 DIAGNOSIS — G309 Alzheimer's disease, unspecified: Secondary | ICD-10-CM | POA: Diagnosis not present

## 2018-04-08 DIAGNOSIS — I5020 Unspecified systolic (congestive) heart failure: Secondary | ICD-10-CM | POA: Diagnosis not present

## 2018-04-08 DIAGNOSIS — N183 Chronic kidney disease, stage 3 (moderate): Secondary | ICD-10-CM | POA: Diagnosis not present

## 2018-04-08 DIAGNOSIS — F0280 Dementia in other diseases classified elsewhere without behavioral disturbance: Secondary | ICD-10-CM | POA: Diagnosis not present

## 2018-04-08 DIAGNOSIS — M170 Bilateral primary osteoarthritis of knee: Secondary | ICD-10-CM | POA: Diagnosis not present

## 2018-04-08 DIAGNOSIS — I130 Hypertensive heart and chronic kidney disease with heart failure and stage 1 through stage 4 chronic kidney disease, or unspecified chronic kidney disease: Secondary | ICD-10-CM | POA: Diagnosis not present

## 2018-04-11 DIAGNOSIS — I5020 Unspecified systolic (congestive) heart failure: Secondary | ICD-10-CM | POA: Diagnosis not present

## 2018-04-11 DIAGNOSIS — G309 Alzheimer's disease, unspecified: Secondary | ICD-10-CM | POA: Diagnosis not present

## 2018-04-11 DIAGNOSIS — N183 Chronic kidney disease, stage 3 (moderate): Secondary | ICD-10-CM | POA: Diagnosis not present

## 2018-04-11 DIAGNOSIS — M170 Bilateral primary osteoarthritis of knee: Secondary | ICD-10-CM | POA: Diagnosis not present

## 2018-04-11 DIAGNOSIS — I130 Hypertensive heart and chronic kidney disease with heart failure and stage 1 through stage 4 chronic kidney disease, or unspecified chronic kidney disease: Secondary | ICD-10-CM | POA: Diagnosis not present

## 2018-04-11 DIAGNOSIS — F0280 Dementia in other diseases classified elsewhere without behavioral disturbance: Secondary | ICD-10-CM | POA: Diagnosis not present

## 2018-04-13 DIAGNOSIS — N183 Chronic kidney disease, stage 3 (moderate): Secondary | ICD-10-CM | POA: Diagnosis not present

## 2018-04-13 DIAGNOSIS — M170 Bilateral primary osteoarthritis of knee: Secondary | ICD-10-CM | POA: Diagnosis not present

## 2018-04-13 DIAGNOSIS — I130 Hypertensive heart and chronic kidney disease with heart failure and stage 1 through stage 4 chronic kidney disease, or unspecified chronic kidney disease: Secondary | ICD-10-CM | POA: Diagnosis not present

## 2018-04-13 DIAGNOSIS — G309 Alzheimer's disease, unspecified: Secondary | ICD-10-CM | POA: Diagnosis not present

## 2018-04-13 DIAGNOSIS — I5020 Unspecified systolic (congestive) heart failure: Secondary | ICD-10-CM | POA: Diagnosis not present

## 2018-04-13 DIAGNOSIS — F0280 Dementia in other diseases classified elsewhere without behavioral disturbance: Secondary | ICD-10-CM | POA: Diagnosis not present

## 2018-04-15 DIAGNOSIS — J209 Acute bronchitis, unspecified: Secondary | ICD-10-CM | POA: Diagnosis not present

## 2018-04-18 DIAGNOSIS — I5020 Unspecified systolic (congestive) heart failure: Secondary | ICD-10-CM | POA: Diagnosis not present

## 2018-04-18 DIAGNOSIS — F0280 Dementia in other diseases classified elsewhere without behavioral disturbance: Secondary | ICD-10-CM | POA: Diagnosis not present

## 2018-04-18 DIAGNOSIS — M170 Bilateral primary osteoarthritis of knee: Secondary | ICD-10-CM | POA: Diagnosis not present

## 2018-04-18 DIAGNOSIS — I130 Hypertensive heart and chronic kidney disease with heart failure and stage 1 through stage 4 chronic kidney disease, or unspecified chronic kidney disease: Secondary | ICD-10-CM | POA: Diagnosis not present

## 2018-04-18 DIAGNOSIS — N183 Chronic kidney disease, stage 3 (moderate): Secondary | ICD-10-CM | POA: Diagnosis not present

## 2018-04-18 DIAGNOSIS — G309 Alzheimer's disease, unspecified: Secondary | ICD-10-CM | POA: Diagnosis not present

## 2018-04-20 DIAGNOSIS — G309 Alzheimer's disease, unspecified: Secondary | ICD-10-CM | POA: Diagnosis not present

## 2018-04-20 DIAGNOSIS — M170 Bilateral primary osteoarthritis of knee: Secondary | ICD-10-CM | POA: Diagnosis not present

## 2018-04-20 DIAGNOSIS — F0280 Dementia in other diseases classified elsewhere without behavioral disturbance: Secondary | ICD-10-CM | POA: Diagnosis not present

## 2018-04-20 DIAGNOSIS — I5020 Unspecified systolic (congestive) heart failure: Secondary | ICD-10-CM | POA: Diagnosis not present

## 2018-04-20 DIAGNOSIS — N183 Chronic kidney disease, stage 3 (moderate): Secondary | ICD-10-CM | POA: Diagnosis not present

## 2018-04-20 DIAGNOSIS — I130 Hypertensive heart and chronic kidney disease with heart failure and stage 1 through stage 4 chronic kidney disease, or unspecified chronic kidney disease: Secondary | ICD-10-CM | POA: Diagnosis not present

## 2018-04-27 DIAGNOSIS — I5020 Unspecified systolic (congestive) heart failure: Secondary | ICD-10-CM | POA: Diagnosis not present

## 2018-04-27 DIAGNOSIS — G309 Alzheimer's disease, unspecified: Secondary | ICD-10-CM | POA: Diagnosis not present

## 2018-04-27 DIAGNOSIS — M170 Bilateral primary osteoarthritis of knee: Secondary | ICD-10-CM | POA: Diagnosis not present

## 2018-04-27 DIAGNOSIS — N183 Chronic kidney disease, stage 3 (moderate): Secondary | ICD-10-CM | POA: Diagnosis not present

## 2018-04-27 DIAGNOSIS — I130 Hypertensive heart and chronic kidney disease with heart failure and stage 1 through stage 4 chronic kidney disease, or unspecified chronic kidney disease: Secondary | ICD-10-CM | POA: Diagnosis not present

## 2018-04-27 DIAGNOSIS — F0280 Dementia in other diseases classified elsewhere without behavioral disturbance: Secondary | ICD-10-CM | POA: Diagnosis not present

## 2018-04-29 DIAGNOSIS — M170 Bilateral primary osteoarthritis of knee: Secondary | ICD-10-CM | POA: Diagnosis not present

## 2018-04-29 DIAGNOSIS — I5020 Unspecified systolic (congestive) heart failure: Secondary | ICD-10-CM | POA: Diagnosis not present

## 2018-04-29 DIAGNOSIS — I130 Hypertensive heart and chronic kidney disease with heart failure and stage 1 through stage 4 chronic kidney disease, or unspecified chronic kidney disease: Secondary | ICD-10-CM | POA: Diagnosis not present

## 2018-04-29 DIAGNOSIS — N183 Chronic kidney disease, stage 3 (moderate): Secondary | ICD-10-CM | POA: Diagnosis not present

## 2018-04-29 DIAGNOSIS — F0280 Dementia in other diseases classified elsewhere without behavioral disturbance: Secondary | ICD-10-CM | POA: Diagnosis not present

## 2018-04-29 DIAGNOSIS — G309 Alzheimer's disease, unspecified: Secondary | ICD-10-CM | POA: Diagnosis not present

## 2018-05-04 DIAGNOSIS — G309 Alzheimer's disease, unspecified: Secondary | ICD-10-CM | POA: Diagnosis not present

## 2018-05-25 DIAGNOSIS — Z1339 Encounter for screening examination for other mental health and behavioral disorders: Secondary | ICD-10-CM | POA: Diagnosis not present

## 2018-05-25 DIAGNOSIS — F0390 Unspecified dementia without behavioral disturbance: Secondary | ICD-10-CM | POA: Diagnosis not present

## 2018-05-25 DIAGNOSIS — F419 Anxiety disorder, unspecified: Secondary | ICD-10-CM | POA: Diagnosis not present

## 2018-05-25 DIAGNOSIS — I2510 Atherosclerotic heart disease of native coronary artery without angina pectoris: Secondary | ICD-10-CM | POA: Diagnosis not present

## 2018-05-25 DIAGNOSIS — I10 Essential (primary) hypertension: Secondary | ICD-10-CM | POA: Diagnosis not present

## 2018-05-25 DIAGNOSIS — R358 Other polyuria: Secondary | ICD-10-CM | POA: Diagnosis not present

## 2018-06-17 DIAGNOSIS — G309 Alzheimer's disease, unspecified: Secondary | ICD-10-CM | POA: Diagnosis not present

## 2018-06-21 DIAGNOSIS — H9193 Unspecified hearing loss, bilateral: Secondary | ICD-10-CM | POA: Diagnosis not present

## 2018-06-21 DIAGNOSIS — H6123 Impacted cerumen, bilateral: Secondary | ICD-10-CM | POA: Diagnosis not present

## 2018-07-28 DIAGNOSIS — M170 Bilateral primary osteoarthritis of knee: Secondary | ICD-10-CM | POA: Diagnosis not present

## 2018-08-02 DIAGNOSIS — F419 Anxiety disorder, unspecified: Secondary | ICD-10-CM | POA: Diagnosis not present

## 2018-08-02 DIAGNOSIS — Z1331 Encounter for screening for depression: Secondary | ICD-10-CM | POA: Diagnosis not present

## 2018-08-02 DIAGNOSIS — I10 Essential (primary) hypertension: Secondary | ICD-10-CM | POA: Diagnosis not present

## 2018-08-02 DIAGNOSIS — Z23 Encounter for immunization: Secondary | ICD-10-CM | POA: Diagnosis not present

## 2018-08-02 DIAGNOSIS — F0390 Unspecified dementia without behavioral disturbance: Secondary | ICD-10-CM | POA: Diagnosis not present

## 2018-08-02 DIAGNOSIS — I2510 Atherosclerotic heart disease of native coronary artery without angina pectoris: Secondary | ICD-10-CM | POA: Diagnosis not present

## 2018-08-04 DIAGNOSIS — L570 Actinic keratosis: Secondary | ICD-10-CM | POA: Diagnosis not present

## 2018-08-11 DIAGNOSIS — H26493 Other secondary cataract, bilateral: Secondary | ICD-10-CM | POA: Diagnosis not present

## 2018-08-12 DIAGNOSIS — G309 Alzheimer's disease, unspecified: Secondary | ICD-10-CM | POA: Diagnosis not present

## 2018-09-29 DIAGNOSIS — G309 Alzheimer's disease, unspecified: Secondary | ICD-10-CM | POA: Diagnosis not present

## 2018-10-04 DIAGNOSIS — I429 Cardiomyopathy, unspecified: Secondary | ICD-10-CM | POA: Diagnosis not present

## 2018-10-04 DIAGNOSIS — R32 Unspecified urinary incontinence: Secondary | ICD-10-CM | POA: Diagnosis not present

## 2018-10-04 DIAGNOSIS — E785 Hyperlipidemia, unspecified: Secondary | ICD-10-CM | POA: Diagnosis not present

## 2018-10-04 DIAGNOSIS — G4700 Insomnia, unspecified: Secondary | ICD-10-CM | POA: Diagnosis not present

## 2018-10-04 DIAGNOSIS — Z87891 Personal history of nicotine dependence: Secondary | ICD-10-CM | POA: Diagnosis not present

## 2018-10-04 DIAGNOSIS — I2510 Atherosclerotic heart disease of native coronary artery without angina pectoris: Secondary | ICD-10-CM | POA: Diagnosis not present

## 2018-10-04 DIAGNOSIS — M170 Bilateral primary osteoarthritis of knee: Secondary | ICD-10-CM | POA: Diagnosis not present

## 2018-10-04 DIAGNOSIS — N183 Chronic kidney disease, stage 3 (moderate): Secondary | ICD-10-CM | POA: Diagnosis not present

## 2018-10-04 DIAGNOSIS — Z9181 History of falling: Secondary | ICD-10-CM | POA: Diagnosis not present

## 2018-10-04 DIAGNOSIS — I5020 Unspecified systolic (congestive) heart failure: Secondary | ICD-10-CM | POA: Diagnosis not present

## 2018-10-04 DIAGNOSIS — I252 Old myocardial infarction: Secondary | ICD-10-CM | POA: Diagnosis not present

## 2018-10-04 DIAGNOSIS — M109 Gout, unspecified: Secondary | ICD-10-CM | POA: Diagnosis not present

## 2018-10-04 DIAGNOSIS — F419 Anxiety disorder, unspecified: Secondary | ICD-10-CM | POA: Diagnosis not present

## 2018-10-04 DIAGNOSIS — F0280 Dementia in other diseases classified elsewhere without behavioral disturbance: Secondary | ICD-10-CM | POA: Diagnosis not present

## 2018-10-04 DIAGNOSIS — G309 Alzheimer's disease, unspecified: Secondary | ICD-10-CM | POA: Diagnosis not present

## 2018-10-04 DIAGNOSIS — I130 Hypertensive heart and chronic kidney disease with heart failure and stage 1 through stage 4 chronic kidney disease, or unspecified chronic kidney disease: Secondary | ICD-10-CM | POA: Diagnosis not present

## 2018-10-06 DIAGNOSIS — R32 Unspecified urinary incontinence: Secondary | ICD-10-CM | POA: Diagnosis not present

## 2018-10-06 DIAGNOSIS — G4700 Insomnia, unspecified: Secondary | ICD-10-CM | POA: Diagnosis not present

## 2018-10-06 DIAGNOSIS — F419 Anxiety disorder, unspecified: Secondary | ICD-10-CM | POA: Diagnosis not present

## 2018-10-06 DIAGNOSIS — G309 Alzheimer's disease, unspecified: Secondary | ICD-10-CM | POA: Diagnosis not present

## 2018-10-06 DIAGNOSIS — F0280 Dementia in other diseases classified elsewhere without behavioral disturbance: Secondary | ICD-10-CM | POA: Diagnosis not present

## 2018-10-06 DIAGNOSIS — Z9181 History of falling: Secondary | ICD-10-CM | POA: Diagnosis not present

## 2018-10-11 DIAGNOSIS — R32 Unspecified urinary incontinence: Secondary | ICD-10-CM | POA: Diagnosis not present

## 2018-10-11 DIAGNOSIS — F0280 Dementia in other diseases classified elsewhere without behavioral disturbance: Secondary | ICD-10-CM | POA: Diagnosis not present

## 2018-10-11 DIAGNOSIS — G4700 Insomnia, unspecified: Secondary | ICD-10-CM | POA: Diagnosis not present

## 2018-10-11 DIAGNOSIS — Z9181 History of falling: Secondary | ICD-10-CM | POA: Diagnosis not present

## 2018-10-11 DIAGNOSIS — F419 Anxiety disorder, unspecified: Secondary | ICD-10-CM | POA: Diagnosis not present

## 2018-10-11 DIAGNOSIS — G309 Alzheimer's disease, unspecified: Secondary | ICD-10-CM | POA: Diagnosis not present

## 2018-10-13 DIAGNOSIS — F419 Anxiety disorder, unspecified: Secondary | ICD-10-CM | POA: Diagnosis not present

## 2018-10-13 DIAGNOSIS — Z9181 History of falling: Secondary | ICD-10-CM | POA: Diagnosis not present

## 2018-10-13 DIAGNOSIS — R32 Unspecified urinary incontinence: Secondary | ICD-10-CM | POA: Diagnosis not present

## 2018-10-13 DIAGNOSIS — G4700 Insomnia, unspecified: Secondary | ICD-10-CM | POA: Diagnosis not present

## 2018-10-13 DIAGNOSIS — F0280 Dementia in other diseases classified elsewhere without behavioral disturbance: Secondary | ICD-10-CM | POA: Diagnosis not present

## 2018-10-13 DIAGNOSIS — G309 Alzheimer's disease, unspecified: Secondary | ICD-10-CM | POA: Diagnosis not present

## 2018-10-18 DIAGNOSIS — F419 Anxiety disorder, unspecified: Secondary | ICD-10-CM | POA: Diagnosis not present

## 2018-10-18 DIAGNOSIS — Z9181 History of falling: Secondary | ICD-10-CM | POA: Diagnosis not present

## 2018-10-18 DIAGNOSIS — G309 Alzheimer's disease, unspecified: Secondary | ICD-10-CM | POA: Diagnosis not present

## 2018-10-18 DIAGNOSIS — G4700 Insomnia, unspecified: Secondary | ICD-10-CM | POA: Diagnosis not present

## 2018-10-18 DIAGNOSIS — F0280 Dementia in other diseases classified elsewhere without behavioral disturbance: Secondary | ICD-10-CM | POA: Diagnosis not present

## 2018-10-18 DIAGNOSIS — R32 Unspecified urinary incontinence: Secondary | ICD-10-CM | POA: Diagnosis not present

## 2018-10-20 DIAGNOSIS — R32 Unspecified urinary incontinence: Secondary | ICD-10-CM | POA: Diagnosis not present

## 2018-10-20 DIAGNOSIS — G309 Alzheimer's disease, unspecified: Secondary | ICD-10-CM | POA: Diagnosis not present

## 2018-10-20 DIAGNOSIS — Z9181 History of falling: Secondary | ICD-10-CM | POA: Diagnosis not present

## 2018-10-20 DIAGNOSIS — G4700 Insomnia, unspecified: Secondary | ICD-10-CM | POA: Diagnosis not present

## 2018-10-20 DIAGNOSIS — F419 Anxiety disorder, unspecified: Secondary | ICD-10-CM | POA: Diagnosis not present

## 2018-10-20 DIAGNOSIS — F0280 Dementia in other diseases classified elsewhere without behavioral disturbance: Secondary | ICD-10-CM | POA: Diagnosis not present

## 2018-10-21 DIAGNOSIS — F0280 Dementia in other diseases classified elsewhere without behavioral disturbance: Secondary | ICD-10-CM | POA: Diagnosis not present

## 2018-10-21 DIAGNOSIS — R32 Unspecified urinary incontinence: Secondary | ICD-10-CM | POA: Diagnosis not present

## 2018-10-21 DIAGNOSIS — G4700 Insomnia, unspecified: Secondary | ICD-10-CM | POA: Diagnosis not present

## 2018-10-21 DIAGNOSIS — Z9181 History of falling: Secondary | ICD-10-CM | POA: Diagnosis not present

## 2018-10-21 DIAGNOSIS — G309 Alzheimer's disease, unspecified: Secondary | ICD-10-CM | POA: Diagnosis not present

## 2018-10-21 DIAGNOSIS — F419 Anxiety disorder, unspecified: Secondary | ICD-10-CM | POA: Diagnosis not present

## 2018-10-24 DIAGNOSIS — G4700 Insomnia, unspecified: Secondary | ICD-10-CM | POA: Diagnosis not present

## 2018-10-24 DIAGNOSIS — G309 Alzheimer's disease, unspecified: Secondary | ICD-10-CM | POA: Diagnosis not present

## 2018-10-24 DIAGNOSIS — Z9181 History of falling: Secondary | ICD-10-CM | POA: Diagnosis not present

## 2018-10-24 DIAGNOSIS — F419 Anxiety disorder, unspecified: Secondary | ICD-10-CM | POA: Diagnosis not present

## 2018-10-24 DIAGNOSIS — F0280 Dementia in other diseases classified elsewhere without behavioral disturbance: Secondary | ICD-10-CM | POA: Diagnosis not present

## 2018-10-24 DIAGNOSIS — R32 Unspecified urinary incontinence: Secondary | ICD-10-CM | POA: Diagnosis not present

## 2018-10-25 DIAGNOSIS — R32 Unspecified urinary incontinence: Secondary | ICD-10-CM | POA: Diagnosis not present

## 2018-10-25 DIAGNOSIS — Z9181 History of falling: Secondary | ICD-10-CM | POA: Diagnosis not present

## 2018-10-25 DIAGNOSIS — G309 Alzheimer's disease, unspecified: Secondary | ICD-10-CM | POA: Diagnosis not present

## 2018-10-25 DIAGNOSIS — F419 Anxiety disorder, unspecified: Secondary | ICD-10-CM | POA: Diagnosis not present

## 2018-10-25 DIAGNOSIS — F0280 Dementia in other diseases classified elsewhere without behavioral disturbance: Secondary | ICD-10-CM | POA: Diagnosis not present

## 2018-10-25 DIAGNOSIS — G4700 Insomnia, unspecified: Secondary | ICD-10-CM | POA: Diagnosis not present

## 2019-01-05 ENCOUNTER — Telehealth: Payer: Self-pay | Admitting: Psychiatry

## 2019-01-05 NOTE — Telephone Encounter (Signed)
RTC wife.  He's in Hospice at home for 3 mos.  She returned to the risperidone bc it helped.  Home since the end of August.  Not walked for 3 mos.  Cannot talk or respond generally.  Risperidone 2 and Ativan prn daytime.  Asks about programs at [facility] for Alzheimer's and aging.  She plans to donate his body for science.  They would like his cognitive testing results for review and the meds.  Can discontinue nonessential medications.  Asks about the [facility].    Expressed her appreciation for our care for him.  She feels at peace.  Did a respite for the first time last week.  Enjoyed it.  Lynder Parents, MD, DFAPA

## 2019-01-05 NOTE — Telephone Encounter (Signed)
Telephone phone call to wife.  Documented.  No further action required

## 2019-01-05 NOTE — Telephone Encounter (Signed)
Wife, Altha Harm, called wanting to talk with you about Aarron's status and get you advise on help.  Please call - [phone].  No appt scheduled

## 2019-02-17 ENCOUNTER — Telehealth: Payer: Self-pay | Admitting: Cardiovascular Disease

## 2019-02-17 NOTE — Telephone Encounter (Signed)
Spoke with patient's wife, who said that Adam Martin is under Hospice care and he will not be coming back to see Dr. Acie Fredrickson.  She expressed how grateful she is for all that Dr. Acie Fredrickson and Sharyn Lull has done for them through the years and how much she appreciates them.

## 2019-03-27 DEATH — deceased
